# Patient Record
Sex: Female | Born: 1990 | Race: Black or African American | Hispanic: No | Marital: Single | Smoking: Never smoker
Health system: Southern US, Community
[De-identification: ages and names within clinical notes are randomized; demographics above are authoritative.]

## PROBLEM LIST (undated history)

## (undated) DIAGNOSIS — E7800 Pure hypercholesterolemia, unspecified: Secondary | ICD-10-CM

## (undated) DIAGNOSIS — R519 Headache, unspecified: Secondary | ICD-10-CM

## (undated) DIAGNOSIS — R51 Headache: Secondary | ICD-10-CM

## (undated) DIAGNOSIS — G5600 Carpal tunnel syndrome, unspecified upper limb: Secondary | ICD-10-CM

## (undated) HISTORY — DX: Carpal tunnel syndrome, unspecified upper limb: G56.00

## (undated) HISTORY — DX: Headache: R51

## (undated) HISTORY — PX: OTHER SURGICAL HISTORY: SHX169

## (undated) HISTORY — DX: Headache, unspecified: R51.9

---

## 2012-08-25 ENCOUNTER — Encounter (HOSPITAL_COMMUNITY): Payer: Self-pay

## 2012-08-25 ENCOUNTER — Emergency Department (INDEPENDENT_AMBULATORY_CARE_PROVIDER_SITE_OTHER): Payer: Self-pay

## 2012-08-25 ENCOUNTER — Emergency Department (INDEPENDENT_AMBULATORY_CARE_PROVIDER_SITE_OTHER)
Admission: EM | Admit: 2012-08-25 | Discharge: 2012-08-25 | Disposition: A | Discharge status: Home / Self Care | Payer: Self-pay | Source: Home / Self Care | Attending: Family Medicine | Admitting: Family Medicine

## 2012-08-25 DIAGNOSIS — S63642A Sprain of metacarpophalangeal joint of left thumb, initial encounter: Secondary | ICD-10-CM

## 2012-08-25 DIAGNOSIS — S63659A Sprain of metacarpophalangeal joint of unspecified finger, initial encounter: Secondary | ICD-10-CM

## 2012-08-25 HISTORY — DX: Pure hypercholesterolemia, unspecified: E78.00

## 2012-08-25 NOTE — ED Notes (Signed)
Radiology reporting system is down.  Patient, MD and RN made aware of additional delay.

## 2012-08-25 NOTE — ED Provider Notes (Signed)
History     CSN: [id number]  Arrival date & time 08/25/12  1424   First MD Initiated Contact with Patient 08/25/12 1435      Chief Complaint  Patient presents with  . Finger Injury    (Consider location/radiation/quality/duration/timing/severity/associated sxs/prior treatment) Patient is a 21 y.o. female presenting with hand injury. The history is provided by the patient.  Hand Injury  The incident occurred more than 1 week ago (hurt left thumb 2 weeks ago playing basketball, continues sore and tends to hit it daily causing pain.). The injury mechanism was a direct blow. The pain is present in the left fingers. The quality of the pain is described as aching. The pain is mild. Pertinent negatives include no fever. She reports no foreign bodies present. The symptoms are aggravated by movement, use and palpation.    Past Medical History  Diagnosis Date  . High cholesterol     History reviewed. No pertinent past surgical history.  History reviewed. No pertinent family history.  History  Substance Use Topics  . Smoking status: Never Smoker   . Smokeless tobacco: Not on file  . Alcohol Use: Yes    OB History    Grav Para Term Preterm Abortions TAB SAB Ect Mult Living                  Review of Systems  Constitutional: Negative for fever.  Musculoskeletal: Positive for joint swelling.    Allergies  Review of patient's allergies indicates no known allergies.  Home Medications  No current outpatient prescriptions on file.  BP 133/65  Pulse 62  Temp 97.3 F (36.3 C) (Oral)  Resp 20  SpO2 100%  LMP 08/23/2012  Physical Exam  Nursing note and vitals reviewed. Constitutional: She is oriented to person, place, and time. She appears well-developed and well-nourished.  Musculoskeletal: She exhibits tenderness.       Hands: Neurological: She is alert and oriented to person, place, and time.  Skin: Skin is warm and dry.    ED Course  Procedures (including critical  care time)  Labs Reviewed - No data to display Dg Finger Thumb Left  08/25/2012  *RADIOLOGY REPORT*  Clinical Data: Pain post trauma  LEFT THUMB 2+V  Comparison: None.  Findings: There is no fracture or dislocation. There is a tiny calcification in the first MCP joint which is probably of arthrographic etiology.  There is no appreciable joint space narrowing or erosive change.  IMPRESSION: No apparent fracture or dislocation.   Original Report Authenticated By: Arvin Collard. WOODRUFF III, M.D.      1. Sprain of metacarpophalangeal joint of left thumb       MDM  X-rays reviewed and report per radiologist.         Linna Hoff, MD 08/25/12 [phone]

## 2012-08-25 NOTE — ED Notes (Signed)
States a couple of days ago, she injured her thumb  that she had previously fractured . Most of pain in area of MP joint, worse w movement

## 2014-01-19 ENCOUNTER — Ambulatory Visit: Payer: BC Managed Care – PPO | Admitting: Podiatry

## 2014-01-26 ENCOUNTER — Ambulatory Visit: Payer: Self-pay | Admitting: Family Medicine

## 2014-03-09 ENCOUNTER — Other Ambulatory Visit: Payer: Self-pay | Admitting: Family

## 2014-03-09 DIAGNOSIS — N6320 Unspecified lump in the left breast, unspecified quadrant: Secondary | ICD-10-CM

## 2014-03-12 ENCOUNTER — Ambulatory Visit
Admission: RE | Admit: 2014-03-12 | Discharge: 2014-03-12 | Disposition: A | Discharge status: Home / Self Care | Payer: BC Managed Care – PPO | Source: Ambulatory Visit | Attending: Family | Admitting: Family

## 2014-03-12 ENCOUNTER — Encounter (INDEPENDENT_AMBULATORY_CARE_PROVIDER_SITE_OTHER): Payer: Self-pay

## 2014-03-12 DIAGNOSIS — N6320 Unspecified lump in the left breast, unspecified quadrant: Secondary | ICD-10-CM

## 2014-04-06 ENCOUNTER — Encounter: Payer: Self-pay | Admitting: Neurology

## 2014-04-06 ENCOUNTER — Ambulatory Visit (INDEPENDENT_AMBULATORY_CARE_PROVIDER_SITE_OTHER): Payer: BC Managed Care – PPO | Admitting: Neurology

## 2014-04-06 VITALS — BP 105/61 | HR 70 | Ht 65.0 in | Wt 161.0 lb

## 2014-04-06 DIAGNOSIS — R202 Paresthesia of skin: Secondary | ICD-10-CM | POA: Insufficient documentation

## 2014-04-06 DIAGNOSIS — R209 Unspecified disturbances of skin sensation: Secondary | ICD-10-CM

## 2014-04-06 NOTE — Progress Notes (Signed)
PATIENT: Cynthia Arroyo DOB: [birth year]  HISTORICAL  Cynthia Arroyo is a 23 years old right-handed African American female, referred to by her primary care physician nurse practitioner Andrew Auebra Smother, for evaluation of bilateral feet, and fingertips paresthesia, She is accompanied by her friend Cynthia Arroyo at today's clinical visit.  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,, packaging, she began to complains of bilateral hands paresthesia since 2014, involving all 5 fingers, right worse than left, also a radiating from her fingers towards the right arm, to her right shoulder, towards her right neck  She also complains of few months history of bilateral feet paresthesia, she has no low back pain, no significant neck pain  She denies bowel and bladder incontinence  REVIEW OF SYSTEMS: Full 14 system review of systems performed and notable only for paresthesia  ALLERGIES: No Known Allergies  HOME MEDICATIONS: No current outpatient prescriptions on file prior to visit.   No current facility-administered medications on file prior to visit.    PAST MEDICAL HISTORY: Past Medical History  Diagnosis Date  . High cholesterol   . Paresthesia     PAST SURGICAL HISTORY: Past Surgical History  Procedure Laterality Date  . None      FAMILY HISTORY: Family History  Problem Relation Age of Onset  . Diabetes Paternal Grandfather   . Diabetes Maternal Grandmother   . Asthma Brother   . High blood pressure Father     SOCIAL HISTORY:  History   Social History  . Marital Status: Single    Spouse Name: N/A    Number of Children: 0  . 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 Smoking status: Never Smoker   . Smokeless tobacco: Never Used  . Alcohol Use: 1.2 oz/week    2 Cans of beer per week     Comment: Social  . Drug Use: Yes    Special: Marijuana  . Sexual  Activity: Not on file   Other Topics Concern  . 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   Education college.   Right handed.   Caffeine none.      PHYSICAL EXAM   Filed Vitals:   04/06/14 0852  BP: 105/61  Pulse: 70  Height: 5\' 5"  (1.651 m)  Weight: 161 lb (73.029 kg)    Not recorded    Body mass index is 26.79 kg/(m^2).   Generalized: In no acute distress  Neck: Supple, no carotid bruits   Cardiac: Regular rate rhythm  Pulmonary: Clear to auscultation bilaterally  Musculoskeletal: No deformity  Neurological examination  Mentation: Alert oriented to time, place, history taking, and causual conversation  Cranial nerve II-XII: Pupils were equal round reactive to light. Extraocular movements were full.  Visual field were full on confrontational test. Bilateral fundi were sharp.  Facial sensation and strength were normal. Hearing was intact to finger rubbing bilaterally. Uvula tongue midline.  Head turning and shoulder shrug and were normal and symmetric.Tongue protrusion into cheek strength was normal.  Motor: Normal tone, bulk and strength.  Sensory: Intact to fine touch, pinprick, preserved vibratory sensation, and proprioception at toes.  Coordination: Normal finger to nose, heel-to-shin bilaterally there was no truncal ataxia  Gait: Rising up from seated position without assistance, normal stance, without trunk  ataxia, moderate stride, good arm swing, smooth turning, able to perform tiptoe, and heel walking without difficulty.   Romberg signs: Negative  Deep tendon reflexes: Brachioradialis 2/2, biceps 2/2, triceps 2/2, patellar 2/2, Achilles 2/2, plantar responses were flexor bilaterally.   DIAGNOSTIC DATA (LABS, IMAGING, TESTING) - I reviewed patient records, labs, notes, testing and imaging myself where available.  ASSESSMENT AND PLAN  Cynthia HarmsSakina Henthorn is a 23 y.o. female  complains of bilateral hands and feet paresthesia, she denies significant gait difficulty, essentially normal neurological examinations.  Differentiation diagnosis including peripheral neuropathy, bilateral carpal tunnel syndromes.  EMG nerve conduction study, Laboratory evaluations   Levert FeinsteinYijun Tarvares Lant, M.D. Ph.D.  [facility] [address], [address] [place], [place] [address] [phone]

## 2014-04-08 LAB — COMPREHENSIVE METABOLIC PANEL
A/G RATIO: 1.9 (ref 1.1–2.5)
ALT: 21 IU/L (ref 0–32)
AST: 33 IU/L (ref 0–40)
Albumin: 4.3 g/dL (ref 3.5–5.5)
Alkaline Phosphatase: 57 IU/L (ref 39–117)
BUN/Creatinine Ratio: 11 (ref 8–20)
BUN: 8 mg/dL (ref 6–20)
CALCIUM: 9.2 mg/dL (ref 8.7–10.2)
CHLORIDE: 104 mmol/L (ref 97–108)
CO2: 23 mmol/L (ref 18–29)
Creatinine, Ser: 0.71 mg/dL (ref 0.57–1.00)
GFR calc Af Amer: 138 mL/min/{1.73_m2} (ref >59)
GFR, EST NON AFRICAN AMERICAN: 120 mL/min/{1.73_m2} (ref >59)
GLUCOSE: 82 mg/dL (ref 65–99)
Globulin, Total: 2.3 g/dL (ref 1.5–4.5)
POTASSIUM: 3.9 mmol/L (ref 3.5–5.2)
Sodium: 141 mmol/L (ref 134–144)
TOTAL PROTEIN: 6.6 g/dL (ref 6.0–8.5)
Total Bilirubin: 0.2 mg/dL (ref 0.0–1.2)

## 2014-04-08 LAB — HGB A1C W/O EAG: Hgb A1c MFr Bld: 6 % — ABNORMAL HIGH (ref 4.8–5.6)

## 2014-04-08 LAB — HEPATITIS PANEL, ACUTE
Hep A IgM: NEGATIVE
Hep B C IgM: NEGATIVE
Hep C Virus Ab: <0.1 s/co ratio (ref 0.0–0.9)
Hepatitis B Surface Ag: NEGATIVE

## 2014-04-08 LAB — PROTEIN ELECTROPHORESIS
A/G RATIO SPE: 1.6 (ref 0.7–2.0)
Albumin ELP: 4.1 g/dL (ref 3.2–5.6)
Alpha 1: 0.1 g/dL (ref 0.1–0.4)
Alpha 2: 0.5 g/dL (ref 0.4–1.2)
BETA: 0.9 g/dL (ref 0.6–1.3)
GAMMA GLOBULIN: 0.9 g/dL (ref 0.5–1.6)
GLOBULIN, TOTAL: 2.5 g/dL (ref 2.0–4.5)

## 2014-04-08 LAB — CBC
HCT: 31.6 % — ABNORMAL LOW (ref 34.0–46.6)
HEMOGLOBIN: 10.2 g/dL — AB (ref 11.1–15.9)
MCH: 26.2 pg — AB (ref 26.6–33.0)
MCHC: 32.3 g/dL (ref 31.5–35.7)
MCV: 81 fL (ref 79–97)
Platelets: 350 10*3/uL (ref 150–379)
RBC: 3.89 x10E6/uL (ref 3.77–5.28)
RDW: 14.4 % (ref 12.3–15.4)
WBC: 4.7 10*3/uL (ref 3.4–10.8)

## 2014-04-08 LAB — HIV ANTIBODY (ROUTINE TESTING W REFLEX): HIV-1/HIV-2 Ab: NONREACTIVE

## 2014-04-08 LAB — RPR: SYPHILIS RPR SCR: NONREACTIVE

## 2014-04-08 LAB — THYROID PANEL WITH TSH
Free Thyroxine Index: 2.3 (ref 1.2–4.9)
T3 Uptake Ratio: 30 % (ref 24–39)
T4, Total: 7.7 ug/dL (ref 4.5–12.0)
TSH: 1.97 u[IU]/mL (ref 0.450–4.500)

## 2014-04-08 LAB — VITAMIN B12: Vitamin B-12: 513 pg/mL (ref 211–946)

## 2014-04-08 LAB — CK: Total CK: 372 U/L — ABNORMAL HIGH (ref 24–173)

## 2014-04-08 LAB — C-REACTIVE PROTEIN: CRP: 0.2 mg/L (ref 0.0–4.9)

## 2014-04-08 LAB — FOLATE: FOLATE: 17.3 ng/mL (ref >3.0)

## 2014-04-13 ENCOUNTER — Ambulatory Visit: Payer: BC Managed Care – PPO

## 2014-04-15 ENCOUNTER — Encounter: Payer: BC Managed Care – PPO | Admitting: Neurology

## 2014-04-23 ENCOUNTER — Ambulatory Visit (INDEPENDENT_AMBULATORY_CARE_PROVIDER_SITE_OTHER): Payer: BC Managed Care – PPO

## 2014-04-23 VITALS — BP 115/67 | HR 71 | Resp 16 | Ht 65.0 in | Wt 160.0 lb

## 2014-04-23 DIAGNOSIS — L600 Ingrowing nail: Secondary | ICD-10-CM

## 2014-04-23 DIAGNOSIS — M79673 Pain in unspecified foot: Secondary | ICD-10-CM

## 2014-04-23 DIAGNOSIS — M7750 Other enthesopathy of unspecified foot: Secondary | ICD-10-CM

## 2014-04-23 DIAGNOSIS — M779 Enthesopathy, unspecified: Secondary | ICD-10-CM

## 2014-04-23 DIAGNOSIS — M79609 Pain in unspecified limb: Secondary | ICD-10-CM

## 2014-04-23 DIAGNOSIS — M7751 Other enthesopathy of right foot: Secondary | ICD-10-CM

## 2014-04-23 DIAGNOSIS — Q742 Other congenital malformations of lower limb(s), including pelvic girdle: Secondary | ICD-10-CM

## 2014-04-23 DIAGNOSIS — M722 Plantar fascial fibromatosis: Secondary | ICD-10-CM

## 2014-04-23 MED ORDER — CEPHALEXIN 500 MG PO CAPS: 500.0000 mg | ORAL_CAPSULE | Freq: Three times a day (TID) | ORAL | Status: DC

## 2014-04-23 NOTE — Progress Notes (Signed)
   Subjective:    Patient ID: Cynthia Arroyo, female    DOB: Jan 20, 1991, 23 y.o.   MRN: [id number]  HPI Comments: "I have an ingrown toenail"  Patient c/o aching 1st toe right, medial border, for several weeks. She has been trimming the corner of toenail out with no relief.  Also, patient states that occasionally she has some discomfort medial feet, bilaterally. She feels knots and says she has flat feet.  Toe Pain  Associated symptoms include numbness.  Foot Pain Associated symptoms include numbness.      Review of Systems  Neurological: Positive for numbness.  All other systems reviewed and are negative.      Objective:   Physical Exam Neurovascular status is intact pedal pulses palpable DP and PT +2/4 capillary refill time 3 seconds all digits skin temperature warm turgor normal no edema rubor pallor or varicosities noted. Nail border the right great toe showing incurvation ingrowing pain to discomfort at the procedure medial border left hallux some years ago which improved that situation. 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 Visit asymptomatic with certain shoes and certain activities. Patient has significant promontory changes on weightbearing with collapse medial arch mid tarsus and promontory changes decreased calcaneal inclination angle increased talar declination angle has symptoms of capsulitis the talonavicular joint as well as plantar fascial symptomology on palpation mid band of the plantar fascial would benefit from orthoses in the future. 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 insertion site       Assessment & Plan:  We'll address the biomechanics of  the foot with orthoses at future followup visit for possible orthotic scan however this time local anesthetic block administered to the right great toe the medial border is excised in a second followed by alcohol wash Betadine ointment and dry sterile dressing applied patient is given instructions for daily soaks and Betadine or Epsom salts in warm water recommend Tylenol as needed for pain prescription for cephalexin for to pharmacy patient will do soaks twice daily as instructed reappointed for followup for nail check as well as possible orthotic scan we'll obtain authorization for orthotics in and check uncovered in the interim. Again 2 weeks followup for nail check and for capsulitis and plantar fascial symptomology patient is also advised at times navicular may be removed however should be a last resort will initially manage that symptomology with orthoses  Alvan Dame DPM

## 2014-04-23 NOTE — Patient Instructions (Signed)

## 2014-05-11 ENCOUNTER — Ambulatory Visit (INDEPENDENT_AMBULATORY_CARE_PROVIDER_SITE_OTHER): Payer: BC Managed Care – PPO

## 2014-05-11 VITALS — BP 105/58 | HR 67 | Resp 14 | Ht 66.0 in | Wt 162.0 lb

## 2014-05-11 DIAGNOSIS — L600 Ingrowing nail: Secondary | ICD-10-CM

## 2014-05-11 DIAGNOSIS — M79673 Pain in unspecified foot: Secondary | ICD-10-CM

## 2014-05-11 DIAGNOSIS — Z09 Encounter for follow-up examination after completed treatment for conditions other than malignant neoplasm: Secondary | ICD-10-CM

## 2014-05-11 DIAGNOSIS — Q742 Other congenital malformations of lower limb(s), including pelvic girdle: Secondary | ICD-10-CM

## 2014-05-11 DIAGNOSIS — M7751 Other enthesopathy of right foot: Secondary | ICD-10-CM

## 2014-05-11 DIAGNOSIS — M7750 Other enthesopathy of unspecified foot: Secondary | ICD-10-CM

## 2014-05-11 DIAGNOSIS — M79609 Pain in unspecified limb: Secondary | ICD-10-CM

## 2014-05-11 NOTE — Progress Notes (Signed)
   Subjective:    Patient ID: Cynthia Arroyo, female    DOB: [birth year], 23 y.o.   MRN: [id number]  HPI Comments: Pt still have the tenderness, but has continued to play basketball and she states that my be the reason for the continued discomfort.  Foot Pain      Review of Systems No new findings or systemic changes noted    Objective:   Physical Exam Neurovascular status is intact pedal pulses are palpable epicritic and proprioceptive sensations intact lateral border of the left hallux nail which was excised Tuesday doing well no discharge or drainage soak for about a week there is no discharge drainage no eschar present at this time no pain or discomfort patient also had problems or talonavicular insertion of the posterior tibial tendon right more so than left still wearing athletic shoes without tying laces stressed the importance of tying laces also recommended OTC orthotics such as super foot orthotic       Assessment & Plan:  Assessment resolved paronychia of the hallux no pain tenderness discomfort noted patient given advice about OTC orthotics and properly some shoes discharge to an as-needed basis for any future followup there's any read exacerbation or need for possible future custom orthotics.  Alvan Dameichard Sikora DPM

## 2014-05-11 NOTE — Patient Instructions (Signed)
Flat Feet Having flat feet is a common condition. One foot or both might be affected. People of any age can have flat feet. In fact, everyone is born with them. But most of the time, the foot gradually develops an arch. That is the curve on the bottom of the foot that creates a gap between the foot and the ground. An arch usually develops in childhood. Sometimes, though, an arch never develops and the foot stays flat on the bottom. Other times, an arch develops but later collapses (caves in). That is what gives the condition its nickname, "fallen arches." The medical term for flat feet is pes planus. Some people have flat feet their whole life and have no problems. For others, the condition causes pain and needs to be corrected.  CAUSES   A problem with the foot's soft tissue; tendons and ligaments could be loose.  This can cause what is called flexible flat feet. That means the shape of the foot changes with pressure. When standing on the toes, a curved arch can be seen. When standing on the ground, the foot is flat.  Wear and tear. Sometimes arches simply flatten over time.  Damage to the posterior tibial tendon. This is the tendon that goes from the inside of the ankle to the bones in the middle of the foot. It is the main support for the arch. If the tendon is injured, stretched or torn, the arch might flatten.  Tarsal coalition. With this condition, two or more bones in the foot are joined together (fused ) during development in the womb. This limits movement and can lead to a flat foot. SYMPTOMS   The foot is even with the ground from toe to heel. Your caregiver will look closely at the inside of the foot while you are standing.  Pain along the bottom of the foot. Some people describe the pain as tightness.  Swelling on the inside of the foot or ankle.  Changes in the way you walk (gait).  The feet lean inward, starting at the ankle (pronation). DIAGNOSIS  To decide if a child or  adult has flat feet, a healthcare provider will probably:  Do a physical examination. This might include having the person stand on his or her toes and then stand normally. The caregiver will also hold the foot and put pressure on the foot in different directions.  Check the person's shoes. The pattern of wear on the soles can offer clues.  Order images (pictures) of the foot. They can help identify the cause of any pain. They also will show injuries to bones or tendons that could be causing the condition. The images can come from:  X-rays.  Computed tomography (CT) scan. This combines X-ray and a computer.  Magnetic resonance imaging (MRI). This uses magnets, radio waves and a computer to take a picture of the foot. It is the best technique to evaluate tendons, ligaments and muscles. TREATMENT   Flexible flat feet usually are painless. Most of the time, gait is not affected. Most children grow out of the condition. Often no treatment is needed. If there is pain, treatment options include:  Orthotics. These are inserts that go in the shoes. They add support and shape to the feet. An orthotic is custom-made from a mold of the foot.  Shoes. Not all shoes are the same. People with flat feet need arch support. However, too much can be painful. It is important to find shoes that offer the right amount   of support. Athletes, especially runners, may need to try shoes made just for people with flatter feet.  Medication. For pain, only take over-the-counter medicine for pain, discomfort, as directed by your caregiver.  Rest. If the feet start to hurt, cut back on the exercise which increases the pain. Use common sense.  For damage to the posterior tibial tendon, options include:  Orthotics. Also adding a wedge on the inside edge may help. This can relieve pressure on the tendon.  Ankle brace, boot or cast. These supports can ease the load on the tendon while it heals.  Surgery. If the tendon is  torn, it might need to be repaired.  For tarsal coalition, similar options apply:  Pain medication.  Orthotics.  A cast and crutches. This keeps weight off the foot.  Physical therapy.  Surgery to remove the bone bridge joining the two bones together. PROGNOSIS  In most people, flat feet do not cause pain or problems. People can go about their normal activities. However, if flat feet are painful, they can and should be treated. Treatment usually relieves the pain. HOME CARE INSTRUCTIONS   Take any medications prescribed by the healthcare provider. Follow the directions carefully.  Wear, or make sure a child wears, orthotics or special shoes if this was suggested. Be sure to ask how often and for how long they should be worn.  Do any exercises or therapy treatments that were suggested.  Take notes on when the pain occurs. This will help healthcare providers decide how to treat the condition.  If surgery is needed, be sure to find out if there is anything that should or should not be done before the operation. SEEK MEDICAL CARE IF:   Pain worsens in the foot or lower leg.  Pain disappears after treatment, but then returns.  Walking or simple exercise becomes difficult or causes foot pain.  Orthotics or special shoes are uncomfortable or painful. Document Released: 09/02/2009 Document Revised: 01/28/2012 Document Reviewed: 09/02/2009 [facility] Patient Information 2015 [employer] [place]. This information is not intended to replace advice given to you by your health care provider. Make sure you discuss any questions you have with your health care provider.   Recommendation is to arch supports to your shoes including or athletic shoes. Super feet orthotics, a sports type insole can be obtained at [employer], or [employer]. Maintain orthotics in your athletic shoes whenever possible. Also were to keep your legs is tied snug to allow the shoe to properly support and  stabilizer foot. Leading laces loose increase his risks of injury and damage to the tendons and ligaments

## 2014-05-14 ENCOUNTER — Ambulatory Visit (INDEPENDENT_AMBULATORY_CARE_PROVIDER_SITE_OTHER): Payer: BC Managed Care – PPO | Admitting: Neurology

## 2014-05-14 ENCOUNTER — Encounter (INDEPENDENT_AMBULATORY_CARE_PROVIDER_SITE_OTHER): Payer: Self-pay

## 2014-05-14 DIAGNOSIS — Z0289 Encounter for other administrative examinations: Secondary | ICD-10-CM

## 2014-05-14 DIAGNOSIS — G5600 Carpal tunnel syndrome, unspecified upper limb: Secondary | ICD-10-CM

## 2014-05-14 DIAGNOSIS — R209 Unspecified disturbances of skin sensation: Secondary | ICD-10-CM

## 2014-05-14 DIAGNOSIS — R202 Paresthesia of skin: Secondary | ICD-10-CM

## 2014-05-14 NOTE — Procedures (Signed)
   NCS (NERVE CONDUCTION STUDY) WITH EMG (ELECTROMYOGRAPHY) REPORT   STUDY DATE: May 14 2014 PATIENT NAME: Cynthia Arroyo DOB: [birth year] MRN: [id number]    TECHNOLOGIST: Gearldine ShownLorraine Jones ELECTROMYOGRAPHER: Levert FeinsteinYan, Yijun M.D.  CLINICAL INFORMATION: 23 years old female, complaining two-month history of bilateral hands and feet paresthesia  FINDINGS: NERVE CONDUCTION STUDY: Bilateral peroneal sensory responses were normal. Bilateral peroneal, tibial motor responses were normal.  Bilateral ulnar sensory and motor responses were normal.  Bilateral median sensory stones showed moderately prolonged peak latency, left worse than right, with normal snap amplitude.  Bilateral median motor responses showed prolonged distal latency, left-sided to moderate, right side is mild, with normal C. map amplitude, absent also has mildly prolonged F-wave latency, was normal conduction velocity  NEEDLE ELECTROMYOGRAPHY: Selected needle examination was performed at right upper extremity muscles, right cervical paraspinal muscles  Needle examination of right pronator teres, extensor digital communis, triceps biceps first dorsal interossei was normal  Right abductor pollicis brevis, normally insertion activity, no spontaneous activity, normal morphology motor unit potential, with mildly decreased recruitment patterns.  There was no spontaneous activity at right cervical paraspinal muscles, right C5-6 and 7  IMPRESSION:   This is an abnormal study. There is electrodiagnostic evidence of bilateral median neuropathy across the wrist, consistent with moderate bilateral carpal tunnel syndromes, left worse than right. There was no evidence of right cervical radiculopathy.     INTERPRETING PHYSICIAN:   Levert FeinsteinYan, Yijun M.D. Ph.D. [facility] [address], [address] [place], [place] [address] [phone]

## 2014-07-19 ENCOUNTER — Emergency Department (HOSPITAL_COMMUNITY)
Admission: EM | Admit: 2014-07-19 | Discharge: 2014-07-19 | Disposition: A | Discharge status: Home / Self Care | Payer: BC Managed Care – PPO | Attending: Emergency Medicine | Admitting: Emergency Medicine

## 2014-07-19 ENCOUNTER — Encounter (HOSPITAL_COMMUNITY): Payer: Self-pay | Admitting: Emergency Medicine

## 2014-07-19 DIAGNOSIS — Z862 Personal history of diseases of the blood and blood-forming organs and certain disorders involving the immune mechanism: Secondary | ICD-10-CM | POA: Diagnosis not present

## 2014-07-19 DIAGNOSIS — R21 Rash and other nonspecific skin eruption: Secondary | ICD-10-CM | POA: Insufficient documentation

## 2014-07-19 DIAGNOSIS — Z8639 Personal history of other endocrine, nutritional and metabolic disease: Secondary | ICD-10-CM | POA: Insufficient documentation

## 2014-07-19 MED ORDER — HYDROCORTISONE 1 % EX CREA: TOPICAL_CREAM | CUTANEOUS | Status: DC

## 2014-07-19 MED ORDER — HYDROXYZINE HCL 25 MG PO TABS: 25.0000 mg | ORAL_TABLET | Freq: Four times a day (QID) | ORAL | Status: DC | PRN

## 2014-07-19 MED ORDER — DIPHENHYDRAMINE HCL 25 MG PO CAPS
25.0000 mg | ORAL_CAPSULE | Freq: Once | ORAL | Status: AC
  Administered 2014-07-19: 25 mg via ORAL
  Filled 2014-07-19: qty 1

## 2014-07-19 NOTE — ED Provider Notes (Signed)
CSN: [id number]     Arrival date & time 07/19/14  0014 History   First MD Initiated Contact with Patient 07/19/14 0020     Chief Complaint  Patient presents with  . Rash     (Consider location/radiation/quality/duration/timing/severity/associated sxs/prior Treatment) HPI  This is a 84 are old female who presents with rash. Patient reports onset of rash last Saturday. She is noted multiple areas of skin redness with associated itching. She thinks she may have been bitten. She has several sites on her left arm, one on her left leg, and the left side of her face. She states that she has a new bed and would have low suspicion for bed bugs. No when she was with has similar symptoms. Patient has not taken anything for her itching. She denies any fevers or systemic symptoms. She denies any new soaps, lotions, medications, foods or any new exposures.  Past Medical History  Diagnosis Date  . High cholesterol   . Paresthesia    Past Surgical History  Procedure Laterality Date  . None     Family History  Problem Relation Age of Onset  . Diabetes Paternal Grandfather   . Diabetes Maternal Grandmother   . Asthma Brother   . High blood pressure Father    History  Substance Use Topics  . Smoking status: Never Smoker   . Smokeless tobacco: Never Used  . Alcohol Use: 1.2 oz/week    2 Cans of beer per week     Comment: Social   OB History   Grav Para Term Preterm Abortions TAB SAB Ect Mult Living                 Review of Systems  Constitutional: Negative for fever.  Skin: Positive for rash.  All other systems reviewed and are negative.     Allergies  Review of patient's allergies indicates no known allergies.  Home Medications   Prior to Admission medications   Medication Sig Start Date End Date Taking? Authorizing Provider  hydrocortisone cream 1 % Apply to affected area 2 times daily 07/19/14   Shon Baton, MD  hydrOXYzine (ATARAX/VISTARIL) 25 MG tablet Take 1 tablet  (25 mg total) by mouth every 6 (six) hours as needed for itching. 07/19/14   Shon Baton, MD   BP 121/50  Pulse 64  Temp(Src) 98.8 F (37.1 C) (Oral)  Resp 20  Ht  (1.676 m)  Wt 160 lb (72.576 kg)  BMI 25.84 kg/m2  SpO2 99%  LMP 06/26/2014 Physical Exam  Nursing note and vitals reviewed. Constitutional: She is oriented to person, place, and time. She appears well-developed and well-nourished.  HENT:  Head: Normocephalic and atraumatic.  Cardiovascular: Normal rate and regular rhythm.   Pulmonary/Chest: Effort normal. No respiratory distress.  Musculoskeletal: She exhibits no edema.  Neurological: She is alert and oriented to person, place, and time.  Skin: Skin is warm and dry.  Multiple <dime sized areas were raised patches over the left arm the largest of which is just distal to the left elbow, and no associated fluctuance or induration, 2 similar lesions noted on the face and one on the left leg, no obvious bite marks or tracks, nonpurulent,mild overlying excoriation  Psychiatric: She has a normal mood and affect.    ED Course  Procedures (including critical care time) Labs Review Labs Reviewed - No data to display  Imaging Review No results found.   EKG Interpretation None      MDM  Final diagnoses:  Rash and nonspecific skin eruption    Patient presents with itchy patches over her left arm, left leg, left face. Denies systemic symptoms. Rash is most consistent with small whelps or urticaria. Given that it is itchy, would suspect allergic component. The suspicion at this time for bite or infection. Patient was given Benadryl. Discussed with patient conservative management including Atarax and topical hydrocortisone. There is no evidence of overlying infection at this time but the patient was given precautions.  After history, exam, and medical workup I feel the patient has been appropriately medically screened and is safe for discharge home. Pertinent  diagnoses were discussed with the patient. Patient was given return precautions.     Shon Baton, MD 07/19/14 0100

## 2014-07-19 NOTE — ED Notes (Signed)
Patient here with complaint of itchy bumps on left arm, left ear, and left leg. States that the bumps appeared on Saturday and haven't improved. Bumps are raised, warm, and reddened. 1 particular spot distal to left elbow appears swollen with significant induration. Denies seeing any insects or experiencing any bites. Explains that they first appeared after waking, rash was not present Friday when going to bed.

## 2014-07-19 NOTE — Discharge Instructions (Signed)
Rash A rash is a change in the color or texture of your skin. There are many different types of rashes. You may have other problems that accompany your rash.  Your rash appears to have an allergic component given the itching and persistence.  Low suspicion for infection.  CAUSES   Infections.  Allergic reactions. This can include allergies to pets or foods.  Certain medicines.  Exposure to certain chemicals, soaps, or cosmetics.  Heat.  Exposure to poisonous plants.  Tumors, both cancerous and noncancerous. SYMPTOMS   Redness.  Scaly skin.  Itchy skin.  Dry or cracked skin.  Bumps.  Blisters.  Pain. DIAGNOSIS  Your caregiver may do a physical exam to determine what type of rash you have. A skin sample (biopsy) may be taken and examined under a microscope. TREATMENT  Treatment depends on the type of rash you have. Your caregiver may prescribe certain medicines. For serious conditions, you may need to see a skin doctor (dermatologist). HOME CARE INSTRUCTIONS   Avoid the substance that caused your rash.  Do not scratch your rash. This can cause infection.  You may take cool baths to help stop itching.  Only take over-the-counter or prescription medicines as directed by your caregiver.  Keep all follow-up appointments as directed by your caregiver. SEEK IMMEDIATE MEDICAL CARE IF:  You have increasing pain, swelling, or redness.  You have a fever.  You have new or severe symptoms.  You have body aches, diarrhea, or vomiting.  Your rash is not better after 3 days. MAKE SURE YOU:  Understand these instructions.  Will watch your condition.  Will get help right away if you are not doing well or get worse. Document Released: 10/26/2002 Document Revised: 01/28/2012 Document Reviewed: 08/20/2011 [facility] Patient Information 2015 [place], [place]. This information is not intended to replace advice given to you by your health care provider. Make sure you discuss  any questions you have with your health care provider.

## 2016-01-11 ENCOUNTER — Encounter (HOSPITAL_COMMUNITY): Payer: Self-pay | Admitting: Emergency Medicine

## 2016-01-11 ENCOUNTER — Emergency Department (HOSPITAL_COMMUNITY)
Admission: EM | Admit: 2016-01-11 | Discharge: 2016-01-12 | Disposition: A | Discharge status: Home / Self Care | Payer: Self-pay | Attending: Emergency Medicine | Admitting: Emergency Medicine

## 2016-01-11 DIAGNOSIS — R519 Headache, unspecified: Secondary | ICD-10-CM

## 2016-01-11 DIAGNOSIS — Z3202 Encounter for pregnancy test, result negative: Secondary | ICD-10-CM | POA: Insufficient documentation

## 2016-01-11 DIAGNOSIS — R51 Headache: Secondary | ICD-10-CM | POA: Insufficient documentation

## 2016-01-11 DIAGNOSIS — Z8639 Personal history of other endocrine, nutritional and metabolic disease: Secondary | ICD-10-CM | POA: Insufficient documentation

## 2016-01-11 DIAGNOSIS — Z7952 Long term (current) use of systemic steroids: Secondary | ICD-10-CM | POA: Insufficient documentation

## 2016-01-11 LAB — CBC WITH DIFFERENTIAL/PLATELET
Basophils Absolute: 0 10*3/uL (ref 0.0–0.1)
Basophils Relative: 1 %
EOS ABS: 0 10*3/uL (ref 0.0–0.7)
EOS PCT: 0 %
HCT: 33.8 % — ABNORMAL LOW (ref 36.0–46.0)
Hemoglobin: 11 g/dL — ABNORMAL LOW (ref 12.0–15.0)
LYMPHS ABS: 1.8 10*3/uL (ref 0.7–4.0)
Lymphocytes Relative: 34 %
MCH: 25.2 pg — AB (ref 26.0–34.0)
MCHC: 32.5 g/dL (ref 30.0–36.0)
MCV: 77.3 fL — ABNORMAL LOW (ref 78.0–100.0)
Monocytes Absolute: 0.4 10*3/uL (ref 0.1–1.0)
Monocytes Relative: 8 %
Neutro Abs: 3 10*3/uL (ref 1.7–7.7)
Neutrophils Relative %: 57 %
PLATELETS: 238 10*3/uL (ref 150–400)
RBC: 4.37 MIL/uL (ref 3.87–5.11)
RDW: 14.5 % (ref 11.5–15.5)
WBC: 5.2 10*3/uL (ref 4.0–10.5)

## 2016-01-11 LAB — URINE MICROSCOPIC-ADD ON

## 2016-01-11 LAB — URINALYSIS, ROUTINE W REFLEX MICROSCOPIC
BILIRUBIN URINE: NEGATIVE
Glucose, UA: NEGATIVE mg/dL
HGB URINE DIPSTICK: NEGATIVE
KETONES UR: 15 mg/dL — AB
Leukocytes, UA: NEGATIVE
Nitrite: NEGATIVE
PROTEIN: 30 mg/dL — AB
Specific Gravity, Urine: 1.024 (ref 1.005–1.030)
pH: 7 (ref 5.0–8.0)

## 2016-01-11 LAB — BASIC METABOLIC PANEL
Anion gap: 9 (ref 5–15)
BUN: 5 mg/dL — AB (ref 6–20)
CO2: 26 mmol/L (ref 22–32)
CREATININE: 0.6 mg/dL (ref 0.44–1.00)
Calcium: 9.2 mg/dL (ref 8.9–10.3)
Chloride: 104 mmol/L (ref 101–111)
GFR calc Af Amer: >60 mL/min (ref >60)
Glucose, Bld: 127 mg/dL — ABNORMAL HIGH (ref 65–99)
POTASSIUM: 3.4 mmol/L — AB (ref 3.5–5.1)
SODIUM: 139 mmol/L (ref 135–145)

## 2016-01-11 LAB — POC URINE PREG, ED: PREG TEST UR: NEGATIVE

## 2016-01-11 MED ORDER — SODIUM CHLORIDE 0.9 % IV BOLUS (SEPSIS)
1000.0000 mL | Freq: Once | INTRAVENOUS | Status: AC
  Administered 2016-01-11: 1000 mL via INTRAVENOUS

## 2016-01-11 MED ORDER — METOCLOPRAMIDE HCL 5 MG/ML IJ SOLN
10.0000 mg | Freq: Once | INTRAMUSCULAR | Status: AC
  Administered 2016-01-11: 10 mg via INTRAVENOUS
  Filled 2016-01-11: qty 2

## 2016-01-11 MED ORDER — OXYCODONE-ACETAMINOPHEN 5-325 MG PO TABS
ORAL_TABLET | ORAL | Status: AC
  Filled 2016-01-11: qty 1

## 2016-01-11 MED ORDER — KETOROLAC TROMETHAMINE 30 MG/ML IJ SOLN
30.0000 mg | Freq: Once | INTRAMUSCULAR | Status: AC
  Administered 2016-01-11: 30 mg via INTRAVENOUS
  Filled 2016-01-11: qty 1

## 2016-01-11 MED ORDER — DIPHENHYDRAMINE HCL 50 MG/ML IJ SOLN
25.0000 mg | Freq: Once | INTRAMUSCULAR | Status: AC
  Administered 2016-01-11: 25 mg via INTRAVENOUS
  Filled 2016-01-11: qty 1

## 2016-01-11 MED ORDER — OXYCODONE-ACETAMINOPHEN 5-325 MG PO TABS
1.0000 | ORAL_TABLET | Freq: Once | ORAL | Status: AC
  Administered 2016-01-11: 1 via ORAL

## 2016-01-11 NOTE — ED Notes (Signed)
Pt. reports headache with nausea/emesis and photophobia onset last week , denies head injury/alert and oriented.

## 2016-01-11 NOTE — ED Notes (Signed)
PA to see and assess patient before RN assessment. See PA-C note. 

## 2016-01-11 NOTE — ED Provider Notes (Signed)
CSN: [id number]     Arrival date & time 01/11/16  2210 History   First MD Initiated Contact with Patient 01/11/16 2328     Chief Complaint  Patient presents with  . Headache    HPI   Cynthia Arroyo is an 25 y.o. female who presents for evaluation of headache. She states the headache began about one week ago. She reports an intermittent, throbbing headache of gradual onset. Denies thunderclap onset. States that it is always frontal and temporal but will at times radiate through her parietal regions to her occiput. She reports associated photophobia, N/V, and lightheadedness. Denies LOC. States she has been unable to eat at times due to nausea. She reports she has had "sinus headaches" before but not a headache like this. She reports it feels like throbbing pressure. She has tried OTC meds with no relief. Denies fever, chills. Denies dizziness, weakness, numbness, gait abnormalities. Denies head injury or LOC.   Past Medical History  Diagnosis Date  . High cholesterol   . Paresthesia    Past Surgical History  Procedure Laterality Date  . None     Family History  Problem Relation Age of Onset  . Diabetes Paternal Grandfather   . Diabetes Maternal Grandmother   . Asthma Brother   . High blood pressure Father    Social History  Substance Use Topics  . Smoking status: Never Smoker   . Smokeless tobacco: Never Used  . Alcohol Use: 1.2 oz/week    2 Cans of beer per week     Comment: Social   OB History    No data available     Review of Systems  All other systems reviewed and are negative.     Allergies  Review of patient's allergies indicates no known allergies.  Home Medications   Prior to Admission medications   Medication Sig Start Date End Date Taking? Authorizing Provider  hydrocortisone cream 1 % Apply to affected area 2 times daily 07/19/14   Shon Baton, MD  hydrOXYzine (ATARAX/VISTARIL) 25 MG tablet Take 1 tablet (25 mg total) by mouth every 6 (six) hours as  needed for itching. 07/19/14   Shon Baton, MD   BP 112/65 mmHg  Pulse 73  Temp(Src) 99 F (37.2 C) (Oral)  Resp 17  Ht  (1.651 m)  Wt 68.947 kg  BMI 25.29 kg/m2  SpO2 100%  LMP 01/01/2016 (Approximate) Physical Exam  Constitutional: She is oriented to person, place, and time. No distress.  HENT:  Head: Atraumatic.  Right Ear: External ear normal.  Left Ear: External ear normal.  Nose: Nose normal.  Mouth/Throat: No oropharyngeal exudate.  Eyes: Conjunctivae and EOM are normal. Pupils are equal, round, and reactive to light. No scleral icterus.  Neck: Normal range of motion. Neck supple. No rigidity. No Brudzinski's sign and no Kernig's sign noted.  Cardiovascular: Normal rate, regular rhythm and normal heart sounds.   Pulmonary/Chest: Effort normal and breath sounds normal. No respiratory distress. She exhibits no tenderness.  Abdominal: Soft. She exhibits no distension. There is no tenderness.  Lymphadenopathy:    She has no cervical adenopathy.  Neurological: She is alert and oriented to person, place, and time. She has normal strength and normal reflexes. No cranial nerve deficit or sensory deficit. Coordination and gait normal.  Skin: Skin is warm and dry. She is not diaphoretic.  Psychiatric: She has a normal mood and affect. Her behavior is normal.  Nursing note and vitals reviewed.  ED Course  Procedures (including critical care time) Labs Review Labs Reviewed  CBC WITH DIFFERENTIAL/PLATELET - Abnormal; Notable for the following:    Hemoglobin 11.0 (*)    HCT 33.8 (*)    MCV 77.3 (*)    MCH 25.2 (*)    All other components within normal limits  BASIC METABOLIC PANEL - Abnormal; Notable for the following:    Potassium 3.4 (*)    Glucose, Bld 127 (*)    BUN 5 (*)    All other components within normal limits  URINALYSIS, ROUTINE W REFLEX MICROSCOPIC (NOT AT [facility]) - Abnormal; Notable for the following:    Color, Urine AMBER (*)    APPearance CLOUDY (*)     Ketones, ur 15 (*)    Protein, ur 30 (*)    All other components within normal limits  URINE MICROSCOPIC-ADD ON - Abnormal; Notable for the following:    Squamous Epithelial / LPF 6-30 (*)    Bacteria, UA MANY (*)    All other components within normal limits  POC URINE PREG, ED    Imaging Review No results found. I have personally reviewed and evaluated these images and lab results as part of my medical decision-making.   EKG Interpretation None      MDM   Final diagnoses:  Acute nonintractable headache, unspecified headache type    Labs drawn in triage unremarkable. Pt was given percocet in triage with minimal relief of symptoms.  UA with many bacteria but I suspect contamination. Will send for culture. Pt denies any urinary complaints at this time. I suspect pt has migraine. She does not have a history of migraines but otherwise has a nonfocal exam with no neuro deficits, no meningismus. She is afebrile and nontoxic appearing. Will give 1L NS bolus and migraine cocktail. I offered CT head though I have low suspicion for acute intra cranial abnormality. Pt declines at this time. Anticipate d/c home with PCP f/u with neuro referral prn.   Pt reports headache completely resolved with meds and fluids. Will d/c home with strict ER return precautions. Resource guide given to establish PCP for f/u. Referral to neuro also given. Pt verbalized agreement and understanding.   Carlene Coria, PA-C 01/12/16 2956  Loren Racer, MD 01/12/16 [phone]

## 2016-01-12 NOTE — ED Notes (Signed)
Patient verbalized understanding of discharge instructions and denies any further needs or questions at this time. VS stable. Patient ambulatory with steady gait.  

## 2016-01-12 NOTE — Discharge Instructions (Signed)
You were seen in the ER today for evaluation of headache. Your bloodwork was normal. Your headache resolved with medication and IV fluids. Please follow up with your primary care provider. If you do not have one, you may contact one from the list below. I also gave you the contact information for Neurology in case the headache returns. Return to the ER for new or worsening symptoms.  Take medications as prescribed. Return to the emergency room for worsening condition or new concerning symptoms. Follow up with your regular doctor. If you don't have a regular doctor use one of the numbers below to establish a primary care doctor.   Emergency Department Resource Guide 1) Find a Doctor and Pay Out of Pocket Although you won't have to find out who is covered by your insurance plan, it is a good idea to ask around and get recommendations. You will then need to call the office and see if the doctor you have chosen will accept you as a new patient and what types of options they offer for patients who are self-pay. Some doctors offer discounts or will set up payment plans for their patients who do not have insurance, but you will need to ask so you aren't surprised when you get to your appointment.  2) Contact Your Local Health Department Not all health departments have doctors that can see patients for sick visits, but many do, so it is worth a call to see if yours does. If you don't know where your local health department is, you can check in your phone book. The CDC also has a tool to help you locate your state's health department, and many state websites also have listings of all of their local health departments.  3) Find a Walk-in Clinic If your illness is not likely to be very severe or complicated, you may want to try a walk in clinic. These are popping up all over the country in pharmacies, drugstores, and shopping centers. They're usually staffed by nurse practitioners or physician assistants that have  been trained to treat common illnesses and complaints. They're usually fairly quick and inexpensive. However, if you have serious medical issues or chronic medical problems, these are probably not your best option.  No Primary Care Doctor: - Call Health Connect at  [phone] - they can help you locate a primary care doctor that  accepts your insurance, provides certain services, etc. - Physician Referral [phone]  Emergency Department Resource Guide 1) Find a Doctor and Pay Out of Pocket Although you won't have to find out who is covered by your insurance plan, it is a good idea to ask around and get recommendations. You will then need to call the office and see if the doctor you have chosen will accept you as a new patient and what types of options they offer for patients who are self-pay. Some doctors offer discounts or will set up payment plans for their patients who do not have insurance, but you will need to ask so you aren't surprised when you get to your appointment.  2) Contact Your Local Health Department Not all health departments have doctors that can see patients for sick visits, but many do, so it is worth a call to see if yours does. If you don't know where your local health department is, you can check in your phone book. The CDC also has a tool to help you locate your state's health department, and many state websites also have listings of  all of their local health departments.  3) Find a Walk-in Clinic If your illness is not likely to be very severe or complicated, you may want to try a walk in clinic. These are popping up all over the country in pharmacies, drugstores, and shopping centers. They're usually staffed by nurse practitioners or physician assistants that have been trained to treat common illnesses and complaints. They're usually fairly quick and inexpensive. However, if you have serious medical issues or chronic medical problems, these are probably not your  best option.  No Primary Care Doctor: - Call Health Connect at  [phone] - they can help you locate a primary care doctor that  accepts your insurance, provides certain services, etc. - Physician Referral Service- [phone]  Chronic Pain Problems: Organization         Address  Phone   Notes  [facility]  [phone] Patients need to be referred by their primary care doctor.   Medication Assistance: Organization         Address  Phone   Notes  [facility] Medication [facility] [address] [place], [place] [address] [phone] --Must be a resident of [facility] -- Must have NO insurance coverage whatsoever (no Medicaid/ Medicare, etc.) -- The pt. MUST have a primary care doctor that directs their care regularly and follows them in the community   [facility]  [phone]   [employer]  [phone]    Agencies that provide inexpensive medical care: Organization         Address  Phone   Notes  [facility]  [phone]   [facility]    [phone]   [facility] [address] [place], [place] [address] [phone]   [facility] [address] [place] [phone]   [facility]    [phone]   [facility]    [phone]   [facility]  [address], [place] Phone:  [phone], Fax:  [phone] Hours of Operation:  9 am - 6 pm, M-F.  Also accepts Medicaid/Medicare and self-pay.  [facility]  [address], [place] Phone: [phone], Fax: [phone]. Hours of Operation:  8:30 am - 5:30 pm, M-F.  Also accepts Medicaid and self-pay.  [facility] [place] [address], [place] Phone: [phone]   [facility] [address] Natasha Bence [place], [place] [phone], Ext. [phone] Mondays & Thursdays: 7-9 AM.  First 15  patients are seen on a first come, first serve basis.    Medicaid-accepting [facility] Providers:  Organization         Address  Phone   Notes  [facility] [address], [place] [phone] Also accepts self-pay patients.  [facility] [address] Laurell Josephs [place], [place]  [phone]   [facility] [address], [place] [phone]   [facility] [address], [place] [phone]   Renaye Rakers [address], [place]   [phone] Only accepts [place] patients after they have their name applied to their card.   Self-Pay (no insurance) in [facility]:  Retail buyer   Notes  Sickle Cell Patients, [facility] [address] [place], [place] [phone]   [facility] [address] [place], [place] [phone]   [facility] [place]  [address], [place] [phone]   [facility]/Dr. Osei-Bonsu  [address] [place] or [address], [place] [phone] Phone number for both [place] and [place] locations is the same.  [facility] [address], [place] [phone]   [facility] [address], [place] or [address] [phone]   [facility] [address] [place] [phone], phone; [phone], fax Sees patients 1st and 3rd Saturday of every month.  Must not qualify for public or private insurance (i.e. Medicaid, Medicare, [place] Health Choice, Veterans' Benefits)  Household income should be no more than 200% of the poverty level The clinic cannot treat you if you are pregnant or think you are pregnant  Sexually transmitted diseases are not treated at the clinic.     Migraine  Headache A migraine headache is an intense, throbbing pain on one or both sides of your head. A migraine can last for 30 minutes to several hours. CAUSES  The exact cause of a migraine headache is not always known. However, a migraine may be caused when nerves in the brain become irritated and release chemicals that cause inflammation. This causes pain. Certain things may also trigger migraines, such as:  Alcohol.  Smoking.  Stress.  Menstruation.  Aged cheeses.  Foods or drinks that contain nitrates, glutamate, aspartame, or tyramine.  Lack of sleep.  Chocolate.  Caffeine.  Hunger.  Physical exertion.  Fatigue.  Medicines used to treat chest pain (nitroglycerine), birth control pills, estrogen, and some blood pressure medicines. SIGNS AND SYMPTOMS  Pain on one or both sides of your head.  Pulsating or throbbing pain.  Severe pain that prevents daily activities.  Pain that is aggravated by any physical activity.  Nausea, vomiting, or both.  Dizziness.  Pain with exposure to bright lights, loud noises, or activity.  General sensitivity to bright lights, loud noises, or smells. Before you get a migraine, you may get warning signs that a migraine is coming (aura). An aura may include:  Seeing flashing lights.  Seeing bright spots, halos, or zigzag lines.  Having tunnel vision or blurred vision.  Having feelings of numbness or tingling.  Having trouble talking.  Having muscle weakness. DIAGNOSIS  A migraine headache is often diagnosed based on:  Symptoms.  Physical exam.  A CT scan or MRI of your head. These imaging tests cannot diagnose migraines, but they can help rule out other causes of headaches. TREATMENT Medicines may be given for pain and nausea. Medicines can also be given to help prevent recurrent migraines.  HOME CARE INSTRUCTIONS  Only take over-the-counter or prescription medicines for pain or discomfort as directed by your health care  provider. The use of long-term narcotics is not recommended.  Lie down in a dark, quiet room when you have a migraine.  Keep a journal to find out what may trigger your migraine headaches. For example, write down:  What you eat and drink.  How much sleep you get.  Any change to your diet or medicines.  Limit alcohol consumption.  Quit smoking if you smoke.  Get 7-9 hours of sleep, or as recommended by your health care provider.  Limit stress.  Keep lights dim if bright lights bother you and make your migraines worse. SEEK IMMEDIATE MEDICAL CARE IF:   Your migraine becomes severe.  You have a fever.  You have a stiff neck.  You have vision loss.  You have muscular weakness or loss of muscle control.  You start losing your balance or have trouble walking.  You feel faint or pass out.  You have severe symptoms that are different from your first symptoms. MAKE SURE YOU:   Understand these instructions.  Will watch your condition.  Will get help right away if you are not doing well or get worse.   This information is not intended to replace advice given to you by your health care provider. Make sure you discuss any questions you have with your health care provider.   Document Released: 11/05/2005 Document Revised: 11/26/2014 Document Reviewed: 07/13/2013 [employer] Interactive Patient Education [employer]

## 2016-01-13 LAB — URINE CULTURE

## 2016-01-16 ENCOUNTER — Ambulatory Visit: Payer: Self-pay | Admitting: Diagnostic Neuroimaging

## 2016-01-19 ENCOUNTER — Encounter: Payer: Self-pay | Admitting: Neurology

## 2016-01-19 ENCOUNTER — Ambulatory Visit (INDEPENDENT_AMBULATORY_CARE_PROVIDER_SITE_OTHER): Payer: Self-pay | Admitting: Neurology

## 2016-01-19 VITALS — BP 110/60 | HR 70 | Ht 65.0 in | Wt 156.0 lb

## 2016-01-19 DIAGNOSIS — G43009 Migraine without aura, not intractable, without status migrainosus: Secondary | ICD-10-CM

## 2016-01-19 DIAGNOSIS — G43909 Migraine, unspecified, not intractable, without status migrainosus: Secondary | ICD-10-CM | POA: Insufficient documentation

## 2016-01-19 MED ORDER — RIZATRIPTAN BENZOATE 5 MG PO TBDP: 5.0000 mg | ORAL_TABLET | ORAL | Status: AC | PRN

## 2016-01-19 NOTE — Progress Notes (Signed)
Chief Complaint  Patient presents with  . Headache    Says she started having daily headaches two weeks ago.  She tried to treat pain with OTC NSAIDS with very little relief.  Reports nausea and light sensitivity.  She went to ED for pain treatment on 01/11/16.      PATIENT: Cynthia Arroyo DOB: 08/18/1991  HISTORICAL  Cynthia Arroyo is a 25 years old right-handed African American female, referred to by her primary care physician nurse practitioner Andrew Au, for evaluation of bilateral feet, and fingertips paresthesia, She is accompanied by her friend Cynthia Arroyo at today's clinical visit.  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,, packaging, she began to complains of bilateral hands paresthesia since 2014, involving all 5 fingers, right worse than left, also a radiating from her fingers towards the right arm, to her right shoulder, towards her right neck  She also complains of few months history of bilateral feet paresthesia, she has no low back pain, no significant neck pain  She denies bowel and bladder incontinence  UPDATE March 2nd 2017: I saw her previously for paresthesia which has improved, last visit was in 2015, Today she came in with new onset headache since Feb 2017, severe holocranial pounding headaches, with associated light noise sensitivity, nauseous, lasting for few hours, with light noise sensitivity, she tried over-the-counter Excedrin Migraine, which only helped her temporarily. She denies visual loss, she denies family history of migraine, she has practiced basketball regularly, no lateralized motor or sensory deficit.  REVIEW OF SYSTEMS: Full 14 system review of systems performed and notable only for paresthesia  ALLERGIES: No Known Allergies  HOME MEDICATIONS: No current outpatient prescriptions on file prior to visit.   No current facility-administered medications on file prior to visit.    PAST MEDICAL  HISTORY: Past Medical History  Diagnosis Date  . High cholesterol   . Carpal tunnel syndrome     Left  . Headache     PAST SURGICAL HISTORY: Past Surgical History  Procedure Laterality Date  . None      FAMILY HISTORY: Family History  Problem Relation Age of Onset  . Diabetes Paternal Grandfather   . Diabetes Maternal Grandmother   . Asthma Brother   . High blood pressure Father   . Healthy Mother     SOCIAL HISTORY:  History   Social History  . Marital Status: Single    Spouse Name: N/A    Number of Children: 0  . 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 Smoking status: Never Smoker   . Smokeless tobacco: Never Used  . Alcohol Use: 1.2 oz/week    2 Cans of beer per week     Comment: Social  . Drug Use: Yes    Special: Marijuana  . Sexual Activity: Not on file   Other Topics Concern  . 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   Education college.   Right handed.   Caffeine none.      PHYSICAL EXAM   Filed Vitals:   01/19/16 1117  BP: 110/60  Pulse: 70  Height:  (1.651 m)  Weight: 156 lb (70.761 kg)    Not recorded      Body mass index is 25.96 kg/(m^2).   Generalized:  In no acute distress  Neck: Supple, no carotid bruits   Cardiac: Regular rate rhythm  Pulmonary: Clear to auscultation bilaterally  Musculoskeletal: No deformity  Neurological examination  Mentation: Alert oriented to time, place, history taking, and causual conversation  Cranial nerve II-XII: Pupils were equal round reactive to light. Extraocular movements were full.  Visual field were full on confrontational test. Bilateral fundi were sharp.  Facial sensation and strength were normal. Hearing was intact to finger rubbing bilaterally. Uvula tongue midline.  Head turning and shoulder shrug and were normal and  symmetric.Tongue protrusion into cheek strength was normal.  Motor: Normal tone, bulk and strength.  Sensory: Intact to fine touch, pinprick, preserved vibratory sensation, and proprioception at toes.  Coordination: Normal finger to nose, heel-to-shin bilaterally there was no truncal ataxia  Gait: Rising up from seated position without assistance, normal stance, without trunk ataxia, moderate stride, good arm swing, smooth turning, able to perform tiptoe, and heel walking without difficulty.   Romberg signs: Negative  Deep tendon reflexes: Brachioradialis 2/2, biceps 2/2, triceps 2/2, patellar 2/2, Achilles 2/2, plantar responses were flexor bilaterally.   DIAGNOSTIC DATA (LABS, IMAGING, TESTING) - I reviewed patient records, labs, notes, testing and imaging myself where available.  ASSESSMENT AND PLAN  Cynthia Arroyo is a 25 y.o. female   New-onset migraine headaches  Maxalt as needed  I also educated patient avoid trigger, treat her migraine headache early on, keep a headache diary  Return to clinic in 2-3 months  Levert Feinstein, M.D. Ph.D.  [facility] [address] [place], [place] [address] [phone]

## 2016-01-28 ENCOUNTER — Emergency Department (HOSPITAL_COMMUNITY)
Admission: EM | Admit: 2016-01-28 | Discharge: 2016-01-28 | Disposition: A | Discharge status: Home / Self Care | Payer: Self-pay | Attending: Emergency Medicine | Admitting: Emergency Medicine

## 2016-01-28 ENCOUNTER — Encounter (HOSPITAL_COMMUNITY): Payer: Self-pay | Admitting: *Deleted

## 2016-01-28 DIAGNOSIS — Z8669 Personal history of other diseases of the nervous system and sense organs: Secondary | ICD-10-CM | POA: Insufficient documentation

## 2016-01-28 DIAGNOSIS — Z8639 Personal history of other endocrine, nutritional and metabolic disease: Secondary | ICD-10-CM | POA: Insufficient documentation

## 2016-01-28 DIAGNOSIS — R51 Headache: Secondary | ICD-10-CM | POA: Insufficient documentation

## 2016-01-28 DIAGNOSIS — R519 Headache, unspecified: Secondary | ICD-10-CM

## 2016-01-28 LAB — I-STAT CHEM 8, ED
BUN: 4 mg/dL — ABNORMAL LOW (ref 6–20)
CALCIUM ION: 1.13 mmol/L (ref 1.12–1.23)
CHLORIDE: 103 mmol/L (ref 101–111)
Creatinine, Ser: 0.6 mg/dL (ref 0.44–1.00)
GLUCOSE: 95 mg/dL (ref 65–99)
HEMATOCRIT: 34 % — AB (ref 36.0–46.0)
Hemoglobin: 11.6 g/dL — ABNORMAL LOW (ref 12.0–15.0)
Potassium: 3 mmol/L — ABNORMAL LOW (ref 3.5–5.1)
Sodium: 141 mmol/L (ref 135–145)
TCO2: 24 mmol/L (ref 0–100)

## 2016-01-28 MED ORDER — METOCLOPRAMIDE HCL 5 MG/ML IJ SOLN
10.0000 mg | Freq: Once | INTRAMUSCULAR | Status: AC
  Administered 2016-01-28: 10 mg via INTRAMUSCULAR
  Filled 2016-01-28: qty 2

## 2016-01-28 MED ORDER — OXYCODONE-ACETAMINOPHEN 5-325 MG PO TABS
1.0000 | ORAL_TABLET | Freq: Once | ORAL | Status: AC
  Administered 2016-01-28: 1 via ORAL
  Filled 2016-01-28: qty 1

## 2016-01-28 MED ORDER — KETOROLAC TROMETHAMINE 60 MG/2ML IM SOLN
60.0000 mg | Freq: Once | INTRAMUSCULAR | Status: AC
  Administered 2016-01-28: 60 mg via INTRAMUSCULAR
  Filled 2016-01-28: qty 2

## 2016-01-28 NOTE — ED Provider Notes (Signed)
CSN: [id number]     Arrival date & time 01/28/16  0139 History   First MD Initiated Contact with Patient 01/28/16 0500     Chief Complaint  Patient presents with  . Headache     HPI Patient presents to the emergency department with complaints of headache over the past 3-4 weeks.  She has seen Dr. Terrace ArabiaYan and of neurology but states medication she prescribed has not been helping.  She has not contacted her for follow-up.  She denies nausea and vomiting.  She reports not try any over-the-counter medication.  She reports photophobia.  No recent injury or trauma.  Pain is moderate in severity.  No fever or chills.  No Neck pain or stiffness.   Past Medical History  Diagnosis Date  . High cholesterol   . Carpal tunnel syndrome     Left  . Headache    Past Surgical History  Procedure Laterality Date  . None     Family History  Problem Relation Age of Onset  . Diabetes Paternal Grandfather   . Diabetes Maternal Grandmother   . Asthma Brother   . High blood pressure Father   . Healthy Mother    Social History  Substance Use Topics  . Smoking status: Never Smoker   . Smokeless tobacco: Never Used  . Alcohol Use: 1.2 oz/week    2 Cans of beer per week     Comment: Social   OB History    No data available     Review of Systems  All other systems reviewed and are negative.     Allergies  Review of patient's allergies indicates no known allergies.  Home Medications   Prior to Admission medications   Medication Sig Start Date End Date Taking? Authorizing Provider  acetaminophen (TYLENOL) 325 MG tablet Take 650 mg by mouth every 6 (six) hours as needed for headache.   Yes Historical Provider, MD  ibuprofen (ADVIL,MOTRIN) 200 MG tablet Take 200 mg by mouth every 6 (six) hours as needed for headache.   Yes Historical Provider, MD  rizatriptan (MAXALT-MLT) 5 MG disintegrating tablet Take 1 tablet (5 mg total) by mouth as needed. May repeat in 2 hours if needed Patient not taking:  Reported on 01/28/2016 01/19/16   [name], MD   BP 113/81 mmHg  Pulse 52  Temp(Src) 98.5 F (36.9 C) (Oral)  Resp 20  SpO2 99%  LMP 01/19/2016 Physical Exam  Constitutional: She is oriented to person, place, and time. She appears well-developed and well-nourished. No distress.  HENT:  Head: Normocephalic and atraumatic.  Eyes: EOM are normal.  Neck: Normal range of motion.  Cardiovascular: Normal rate, regular rhythm and normal heart sounds.   Pulmonary/Chest: Effort normal and breath sounds normal.  Abdominal: Soft. She exhibits no distension. There is no tenderness.  Musculoskeletal: Normal range of motion.  Neurological: She is alert and oriented to person, place, and time.  Skin: Skin is warm and dry.  Psychiatric: She has a normal mood and affect. Judgment normal.  Nursing note and vitals reviewed.   ED Course  Procedures (including critical care time) Labs Review Labs Reviewed  I-STAT CHEM 8, ED - Abnormal; Notable for the following:    Potassium 3.0 (*)    BUN 4 (*)    Hemoglobin 11.6 (*)    HCT 34.0 (*)    All other components within normal limits    Imaging Review No results found. I have personally reviewed and evaluated these images and  lab results as part of my medical decision-making.   EKG Interpretation None      MDM   Final diagnoses:  Headache, unspecified headache type    6:20 AM Patient feels much better at this time.  Discharge home in good condition.  Primary care and neurology follow-up.  Resolution of symptoms.  Normal neurologic exam.  No indication for imaging.    Azalia Bilis, MD 01/28/16 [phone]

## 2016-01-28 NOTE — ED Notes (Signed)
Called pt multiple times in the waiting room.  Was able to go over and wake pt up on bench.

## 2016-01-28 NOTE — Discharge Instructions (Signed)

## 2016-01-28 NOTE — ED Notes (Signed)
The pt has had a headache for 3-4 weeks.  She has been seen here in the ed and she has seen a neurologist for the same she reports that she is afraid she has an aneurysm.  She does not want benadryl or percocet it just makes her sleepy  lmp last week

## 2016-01-28 NOTE — ED Notes (Signed)
Pt stable, ambulatory, states understanding of discharge instructions 

## 2016-01-28 NOTE — ED Notes (Signed)
Unable to locate pt. at waiting area several times.  

## 2016-02-09 ENCOUNTER — Encounter: Payer: Self-pay | Admitting: Neurology

## 2016-02-21 ENCOUNTER — Encounter: Payer: Self-pay | Admitting: Adult Health

## 2016-02-21 ENCOUNTER — Ambulatory Visit: Payer: Self-pay | Admitting: Adult Health

## 2016-02-21 ENCOUNTER — Ambulatory Visit (INDEPENDENT_AMBULATORY_CARE_PROVIDER_SITE_OTHER): Payer: Self-pay | Admitting: Adult Health

## 2016-02-21 VITALS — BP 142/80 | HR 76 | Resp 20 | Ht 66.0 in | Wt 146.0 lb

## 2016-02-21 DIAGNOSIS — R51 Headache: Secondary | ICD-10-CM

## 2016-02-21 DIAGNOSIS — G4484 Primary exertional headache: Secondary | ICD-10-CM

## 2016-02-21 MED ORDER — INDOMETHACIN 25 MG PO CAPS: ORAL_CAPSULE | ORAL | Status: DC

## 2016-02-21 NOTE — Patient Instructions (Signed)
MRI brain If your symptoms worsen or you develop new symptoms please let us know.   

## 2016-02-21 NOTE — Progress Notes (Signed)
I have reviewed and agreed above plan. 

## 2016-02-21 NOTE — Progress Notes (Signed)
PATIENT: Cynthia Arroyo DOB: [birth year]  REASON FOR VISIT: follow up- headache HISTORY FROM: patient  HISTORY OF PRESENT ILLNESS: Today 02/21/2016: Is [name] is a 25 year old female with a history of headaches. She returns today for follow-up. She reports that Maxalt has given her no benefit for headache. She states that she  has a headache daily. She has noticed that her headache only occurs with exertional activity. The patient plays semi-pro basketball. She states in her practices when she starts to run that is when her headache starts. She describes it as a throbbing sensation that circles the entire head. She denies photophobia and phonophobia. Occasionally she will have nausea. The patient does state that during the onset of the headache she will become very lightheaded and dizzy. She states that she has also noticed that when she carries groceries up to her third-floor apartment this can also cause headache. She states that prior to the headaches she could carry all of her groceries up 3 floors with no difficulty. She returns today for an evaluation.  HISTORY Terrace Arabia): [name] is a 25 years old right-handed African American female, referred to by her primary care physician nurse practitioner Andrew Au, for evaluation of bilateral feet, and fingertips paresthesia, She is accompanied by her friend Nona Dell at today's clinical visit.  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,, packaging, she began to complains of bilateral hands paresthesia since 2014, involving all 5 fingers, right worse than left, also a radiating from her fingers towards the right arm, to her right shoulder, towards her right neck  She also complains of few months history of bilateral feet paresthesia, she has no low back pain, no significant neck pain  She denies bowel and bladder incontinence  UPDATE March 2nd 2017: I saw her previously for paresthesia which has  improved, last visit was in 2015, Today she came in with new onset headache since Feb 2017, severe holocranial pounding headaches, with associated light noise sensitivity, nauseous, lasting for few hours, with light noise sensitivity, she tried over-the-counter Excedrin Migraine, which only helped her temporarily. She denies visual loss, she denies family history of migraine, she has practiced basketball regularly, no lateralized motor or sensory deficit.    REVIEW OF SYSTEMS: Out of a complete 14 system review of symptoms, the patient complains only of the following symptoms, and all other reviewed systems are negative.  ALLERGIES: No Known Allergies  HOME MEDICATIONS: Outpatient Prescriptions Prior to Visit  Medication Sig Dispense Refill  . acetaminophen (TYLENOL) 325 MG tablet Take 650 mg by mouth every 6 (six) hours as needed for headache. Reported on 02/21/2016    . ibuprofen (ADVIL,MOTRIN) 200 MG tablet Take 200 mg by mouth every 6 (six) hours as needed for headache. Reported on 02/21/2016    . rizatriptan (MAXALT-MLT) 5 MG disintegrating tablet Take 1 tablet (5 mg total) by mouth as needed. May repeat in 2 hours if needed (Patient not taking: Reported on 02/21/2016) 12 tablet 6   No facility-administered medications prior to visit.    PAST MEDICAL HISTORY: Past Medical History  Diagnosis Date  . High cholesterol   . Carpal tunnel syndrome     Left  . Headache     PAST SURGICAL HISTORY: Past Surgical History  Procedure Laterality Date  . None      FAMILY HISTORY: Family History  Problem Relation Age of Onset  . Diabetes Paternal Grandfather   . Diabetes Maternal Grandmother   .  Asthma Brother   . High blood pressure Father   . Healthy Mother     SOCIAL HISTORY: Social History   Social History  . Marital Status: Single    Spouse Name: N/A  . Number of Children: 0  . Years of Education: college   Occupational History  .      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 Smoking status: Never Smoker   . Smokeless tobacco: Never Used  . Alcohol Use: 1.2 oz/week    2 Cans of beer per week     Comment: Social  . Drug Use: Yes    Special: Marijuana     Comment: Marijuana use  . Sexual Activity: Not on file   Other Topics Concern  . 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   Education college.   Right handed.   Caffeine none.       PHYSICAL EXAM  Filed Vitals:   02/21/16 1159  BP: 142/80  Pulse: 76  Resp: 20  Height:  (1.676 m)  Weight: 146 lb (66.225 kg)   Body mass index is 23.58 kg/(m^2).  Generalized: Well developed, in no acute distress   Neurological examination  Mentation: Alert oriented to time, place, history taking. Follows all commands speech and language fluent Cranial nerve II-XII: Pupils were equal round reactive to light. Extraocular movements were full, visual field were full on confrontational test. Facial sensation and strength were normal. Uvula tongue midline. Head turning and shoulder shrug  were normal and symmetric. Motor: The motor testing reveals 5 over 5 strength of all 4 extremities. Good symmetric motor tone is noted throughout.  Sensory: Sensory testing is intact to soft touch on all 4 extremities. No evidence of extinction is noted.  Coordination: Cerebellar testing reveals good finger-nose-finger and heel-to-shin bilaterally.  Gait and station: Gait is normal. Tandem gait is normal. Romberg is negative. No drift is seen.  Reflexes: Deep tendon reflexes are symmetric and normal bilaterally.   DIAGNOSTIC DATA (LABS, IMAGING, TESTING) - I reviewed patient records, labs, notes, testing and imaging myself where available.  Lab Results  Component Value Date   WBC 5.2 01/11/2016   HGB 11.6* 01/28/2016   HCT 34.0* 01/28/2016   MCV 77.3* 01/11/2016   PLT 238 01/11/2016      Component Value Date/Time   NA 141  01/28/2016 0517   NA 141 04/06/2014 0916   K 3.0* 01/28/2016 0517   CL 103 01/28/2016 0517   CO2 26 01/11/2016 2232   GLUCOSE 95 01/28/2016 0517   GLUCOSE 82 04/06/2014 0916   BUN 4* 01/28/2016 0517   BUN 8 04/06/2014 0916   CREATININE 0.60 01/28/2016 0517   CALCIUM 9.2 01/11/2016 2232   PROT 6.6 04/06/2014 0916   ALBUMIN 4.3 04/06/2014 0916   AST 33 04/06/2014 0916   ALT 21 04/06/2014 0916   ALKPHOS 57 04/06/2014 0916   BILITOT 0.2 04/06/2014 0916   GFRNONAA >60 01/11/2016 2232   GFRAA >60 01/11/2016 2232       ASSESSMENT AND PLAN 25 y.o. year old female  has a past medical history of High cholesterol; Carpal tunnel syndrome; and Headache. here with:  1. Exertional headache  The patient reports  headache primarily with exertion. Her physical exam is unremarkable. We will check an MRI/MRA of the head to look for any acute causes. The patient  will try taking indomethacin 25 mg 30-60 minutes prior to activity to prevent headaches. The patient will avoid strenuous activity/ exercise until after her scans. Patient is amenable to this plan. She will follow-up in 3-4 months or sooner if needed.  Butch PennyMegan [place], MSN, NP-C 02/21/2016, 1:17 PM [facility] [address], [address] [place], [place] [address] [phone]

## 2016-02-22 ENCOUNTER — Other Ambulatory Visit: Payer: Self-pay | Admitting: Adult Health

## 2016-02-22 DIAGNOSIS — G4484 Primary exertional headache: Secondary | ICD-10-CM

## 2016-02-29 ENCOUNTER — Telehealth: Payer: Self-pay

## 2016-02-29 MED ORDER — INDOMETHACIN 25 MG PO CAPS: ORAL_CAPSULE | ORAL | Status: AC

## 2016-02-29 NOTE — Telephone Encounter (Signed)
Sig adjusted and sent to [facility].

## 2016-02-29 NOTE — Telephone Encounter (Signed)
Pt's [facility] pharmacy is requesting clarification on the indocin RX. How many capsules should pt take? Current sig is "Take 30-60 minutes before exertional activity."  I assume it is just one capsule, but they sig needs to be changed to reflect this.

## 2016-05-22 ENCOUNTER — Emergency Department (HOSPITAL_COMMUNITY)
Admission: EM | Admit: 2016-05-22 | Discharge: 2016-05-22 | Disposition: A | Discharge status: Home / Self Care | Payer: Self-pay | Attending: Emergency Medicine | Admitting: Emergency Medicine

## 2016-05-22 ENCOUNTER — Encounter (HOSPITAL_COMMUNITY): Payer: Self-pay | Admitting: Emergency Medicine

## 2016-05-22 ENCOUNTER — Emergency Department (HOSPITAL_COMMUNITY): Payer: Self-pay

## 2016-05-22 DIAGNOSIS — Y929 Unspecified place or not applicable: Secondary | ICD-10-CM | POA: Insufficient documentation

## 2016-05-22 DIAGNOSIS — Y999 Unspecified external cause status: Secondary | ICD-10-CM | POA: Insufficient documentation

## 2016-05-22 DIAGNOSIS — W230XXA Caught, crushed, jammed, or pinched between moving objects, initial encounter: Secondary | ICD-10-CM | POA: Insufficient documentation

## 2016-05-22 DIAGNOSIS — Y9367 Activity, basketball: Secondary | ICD-10-CM | POA: Insufficient documentation

## 2016-05-22 DIAGNOSIS — Z79899 Other long term (current) drug therapy: Secondary | ICD-10-CM | POA: Insufficient documentation

## 2016-05-22 DIAGNOSIS — S63602A Unspecified sprain of left thumb, initial encounter: Secondary | ICD-10-CM | POA: Insufficient documentation

## 2016-05-22 NOTE — Progress Notes (Signed)
Orthopedic Tech Progress Note Patient Details:  Cynthia HarmsSakina Arroyo 1991/11/13 [id number]  Ortho Devices Type of Ortho Device: Thumb velcro splint Ortho Device/Splint Location: lue Ortho Device/Splint Interventions: Application   Cynthia Arroyo 05/22/2016, 5:05 PM

## 2016-05-22 NOTE — ED Notes (Signed)
Patient Alert and oriented X4. Stable and ambulatory. Patient verbalized understanding of the discharge instructions.  Patient belongings were taken by the patient. [facility]

## 2016-05-22 NOTE — ED Provider Notes (Signed)
CSN: [id number]     Arrival date & time 05/22/16  1531 History  By signing my name below, I, Soijett Blue, attest that this documentation has been prepared under the direction and in the presence of Kerrie BuffaloHope Neese, NP Electronically Signed: Soijett Blue, ED Scribe. 05/22/2016. 4:40 PM.   Chief Complaint  Patient presents with  . Finger Injury     Patient is a 25 y.o. female presenting with hand injury. The history is provided by the patient. No language interpreter was used.  Hand Injury Location:  Finger Injury: yes   Mechanism of injury comment:  Jammed left thumb while playing basketball Finger location:  L thumb Pain details:    Quality:  Unable to specify   Radiates to:  Does not radiate   Severity:  Mild   Onset quality:  Sudden   Duration:  5 days   Timing:  Constant   Progression:  Unchanged Chronicity:  New Dislocation: no   Foreign body present:  No foreign bodies Tetanus status:  Unknown Prior injury to area:  Yes Relieved by:  None tried Worsened by:  Movement Ineffective treatments:  None tried Associated symptoms: swelling   Associated symptoms: no decreased range of motion     Cynthia Arroyo is a 25 y.o. female who presents to the Emergency Department complaining of finger injury occurring 5 days ago. Pt notes that she was playing basketball when she jammed her left thumb. Pt reports that her left thumb pain is exacerbated with movement and denies any alleviating factors. Pt states that since the initial incident, she has accidentally struck the left thumb multiple times. Pt notes that she has fractured her left thumb when she was younger. Pt is having associated symptoms of left thumb pain. She notes that she has not tried any medications for the relief of her symptoms. She denies color change, wound, rash, and any other symptoms.   Past Medical History  Diagnosis Date  . High cholesterol   . Carpal tunnel syndrome     Left  . Headache    Past Surgical History   Procedure Laterality Date  . None     Family History  Problem Relation Age of Onset  . Diabetes Paternal Grandfather   . Diabetes Maternal Grandmother   . Asthma Brother   . High blood pressure Father   . Healthy Mother    Social History  Substance Use Topics  . Smoking status: Never Smoker   . Smokeless tobacco: Never Used  . Alcohol Use: 1.2 oz/week    2 Cans of beer per week     Comment: Social   OB History    No data available     Review of Systems  Musculoskeletal: Positive for joint swelling (left thumb) and arthralgias (left thumb).  Skin: Negative for color change, rash and wound.  All other systems reviewed and are negative.     Allergies  Review of patient's allergies indicates no known allergies.  Home Medications   Prior to Admission medications   Medication Sig Start Date End Date Taking? Authorizing Provider  acetaminophen (TYLENOL) 325 MG tablet Take 650 mg by mouth every 6 (six) hours as needed for headache. Reported on 02/21/2016    Historical Provider, MD  ibuprofen (ADVIL,MOTRIN) 200 MG tablet Take 200 mg by mouth every 6 (six) hours as needed for headache. Reported on 02/21/2016    Historical Provider, MD  indomethacin (INDOCIN) 25 MG capsule Take 1 capsule 30-60 minutes before exertional activity 02/29/16  [name], NP  rizatriptan (MAXALT-MLT) 5 MG disintegrating tablet Take 1 tablet (5 mg total) by mouth as needed. May repeat in 2 hours if needed Patient not taking: Reported on 02/21/2016 01/19/16   [name], MD   BP 104/79 mmHg  Pulse 71  Temp(Src) 97.9 F (36.6 C) (Oral)  Resp 16  SpO2 95% Physical Exam  Constitutional: She is oriented to person, place, and time. She appears well-developed and well-nourished. No distress.  HENT:  Head: Normocephalic and atraumatic.  Eyes: EOM are normal.  Neck: Neck supple.  Cardiovascular: Normal rate.   Pulses:      Radial pulses are 2+ on the left side.  Radial pulse 2+  Pulmonary/Chest: Effort  normal. No respiratory distress.  Abdominal: She exhibits no distension.  Musculoskeletal: Normal range of motion.       Left hand: She exhibits tenderness and swelling.  Tenderness with palpation and ROM of left thumb. Swelling noted to thenar area. Adequate cap refill.   Neurological: She is alert and oriented to person, place, and time.  Skin: Skin is warm and dry.  Psychiatric: She has a normal mood and affect. Her behavior is normal.  Nursing note and vitals reviewed.   ED Course  Procedures (including critical care time) DIAGNOSTIC STUDIES: Oxygen Saturation is 95% on RA, adequate by my interpretation.    COORDINATION OF CARE: 4:40 PM Discussed treatment plan with pt at bedside which includes left thumb xray and pt agreed to plan.  Thumb spica splint, ice, ibuprofen and f/u with ortho,  Discussed with the patient possible ligament injury.      Imaging Review Dg Finger Thumb Left  05/22/2016  CLINICAL DATA:  Jammed left thumb playing basketball 5 days ago EXAM: LEFT THUMB 2+V COMPARISON:  08/25/2012 FINDINGS: Three views of the left thumb submitted. No acute fracture or subluxation. No radiopaque foreign body. IMPRESSION: Negative. Electronically Signed   By: Natasha MeadLiviu  Pop M.D.   On: 05/22/2016 16:06    MDM   Final diagnoses:  Thumb sprain, left, initial encounter    Patient X-Ray negative for obvious fracture or dislocation.  Pt advised to follow up with orthopedics. Patient given splint while in ED, conservative therapy recommended and discussed. Patient will be discharged home & is agreeable with above plan. Returns precautions discussed. Pt appears safe for discharge.  I personally performed the services described in this documentation, which was scribed in my presence. The recorded information has been reviewed and is accurate.    [address] [employer] [place] Ne[place], NP 05/22/16 1703  Leta BaptistEmily Roe Nguyen, MD 05/24/16 [phone]

## 2016-05-22 NOTE — ED Notes (Signed)
Pt sts left thumb pain after hurting playing basketball

## 2016-05-23 ENCOUNTER — Ambulatory Visit: Payer: Self-pay | Admitting: Adult Health

## 2016-05-24 ENCOUNTER — Encounter: Payer: Self-pay | Admitting: Adult Health

## 2016-10-01 ENCOUNTER — Emergency Department (HOSPITAL_COMMUNITY)
Admission: EM | Admit: 2016-10-01 | Discharge: 2016-10-01 | Disposition: A | Discharge status: Home / Self Care | Payer: Self-pay | Attending: Emergency Medicine | Admitting: Emergency Medicine

## 2016-10-01 ENCOUNTER — Encounter (HOSPITAL_COMMUNITY): Payer: Self-pay | Admitting: *Deleted

## 2016-10-01 DIAGNOSIS — H109 Unspecified conjunctivitis: Secondary | ICD-10-CM | POA: Insufficient documentation

## 2016-10-01 MED ORDER — POLYMYXIN B-TRIMETHOPRIM 10000-0.1 UNIT/ML-% OP SOLN
1.0000 [drp] | OPHTHALMIC | Status: DC
  Administered 2016-10-01: 1 [drp] via OPHTHALMIC
  Filled 2016-10-01: qty 10

## 2016-10-01 NOTE — ED Provider Notes (Signed)
[facility]-EMERGENCY DEPT Provider Note   CSN: [id number] Arrival date & time: 10/01/16  1652  By signing my name below, I, Cynthia Arroyo, attest that this documentation has been prepared under the direction and in the presence of  Keayra Graham, PA-C. Electronically Signed: Clovis PuAvnee Arroyo, ED Scribe. 10/01/16. 5:45 PM.   History   Chief Complaint Chief Complaint  Patient presents with  . Conjunctivitis    The history is provided by the patient. No language interpreter was used.   HPI Comments:  Cynthia Arroyo is a 25 y.o. female who presents to the Emergency Department complaining of sudden onset, worsening bilateral eye redness x 1 week. Pt notes associated eye itching/pain and eye drainage. States her wife and her child have same sympoms.  She has used her wife's antibacterial eye drops for 2 days with no relief. No exacerbating factors noted. Pt denies fever, rhinorrhea, sore throat, cough, eye contact and eye glasses use. Pt denies any other symptoms or complaints at this time.  Past Medical History:  Diagnosis Date  . Carpal tunnel syndrome    Left  . Headache   . High cholesterol     Patient Active Problem List   Diagnosis Date Noted  . Migraine 01/19/2016  . Paresthesia     Past Surgical History:  Procedure Laterality Date  . None      OB History    No data available       Home Medications    Prior to Admission medications   Medication Sig Start Date End Date Taking? Authorizing Provider  acetaminophen (TYLENOL) 325 MG tablet Take 650 mg by mouth every 6 (six) hours as needed for headache. Reported on 02/21/2016    Historical Provider, MD  ibuprofen (ADVIL,MOTRIN) 200 MG tablet Take 200 mg by mouth every 6 (six) hours as needed for headache. Reported on 02/21/2016    Historical Provider, MD  indomethacin (INDOCIN) 25 MG capsule Take 1 capsule 30-60 minutes before exertional activity 02/29/16   [name], NP  rizatriptan (MAXALT-MLT) 5 MG disintegrating tablet  Take 1 tablet (5 mg total) by mouth as needed. May repeat in 2 hours if needed Patient not taking: Reported on 02/21/2016 01/19/16   [name], MD    Family History Family History  Problem Relation Age of Onset  . High blood pressure Father   . Healthy Mother   . Diabetes Paternal Grandfather   . Diabetes Maternal Grandmother   . Asthma Brother     Social History Social History  Substance Use Topics  . Smoking status: Never Smoker  . Smokeless tobacco: Never Used  . Alcohol use 1.2 oz/week    2 Cans of beer per week     Comment: Social     Allergies   Patient has no known allergies.   Review of Systems Review of Systems  Constitutional: Negative for fever.  HENT: Negative for rhinorrhea and sore throat.   Eyes: Positive for pain, discharge, redness and itching. Negative for visual disturbance.  Respiratory: Negative for cough.      Physical Exam Updated Vital Signs BP 133/63 (BP Location: Left Arm)   Pulse 85   Temp 98.2 F (36.8 C) (Oral)   Resp 16   Ht 5\' 6"  (1.676 m)   Wt 145 lb (65.8 kg)   LMP 08/31/2016 (Approximate)   SpO2 100%   BMI 23.40 kg/m   Physical Exam  Constitutional: She is oriented to person, place, and time. She appears well-developed and well-nourished. No distress.  HENT:  Head: Normocephalic and atraumatic.  Right Ear: External ear normal.  Left Ear: External ear normal.  Nose: Nose normal.  Mouth/Throat: Oropharynx is clear and moist.  Eyes:  Bilateral conjunctival injection. PERRLA. Normal lids. No foreign body. Normal extra ocular movements. No photophobia.  Neck: Neck supple.  Cardiovascular: Normal rate.   Pulmonary/Chest: Effort normal.  Abdominal: She exhibits no distension.  Neurological: She is alert and oriented to person, place, and time.  Skin: Skin is warm and dry.  Psychiatric: She has a normal mood and affect.  Nursing note and vitals reviewed.  ED Treatments / Results  DIAGNOSTIC STUDIES:  Oxygen Saturation is  100% on RA, normal by my interpretation.    COORDINATION OF CARE:  5:40 PM Discussed treatment plan with pt at bedside and pt agreed to plan.  Labs (all labs ordered are listed, but only abnormal results are displayed) Labs Reviewed - No data to display  EKG  EKG Interpretation None       Radiology No results found.  Procedures Procedures (including critical care time)  Medications Ordered in ED Medications  trimethoprim-polymyxin b (POLYTRIM) ophthalmic solution 1 drop (not administered)     Initial Impression / Assessment and Plan / ED Course  I have reviewed the triage vital signs and the nursing notes.  Pertinent labs & imaging results that were available during my care of the patient were reviewed by me and considered in my medical decision making (see chart for details).  Clinical Course     Patient presentation consistent with conjunctivitis.  Normal visual acuity. Pt does not wear contacts. Doubt corneal abrasion, ulcers, herpes keratitis, uveitis given symptoms in both eyes and her baby and significant other has same symptoms.   Pt discharged with polytrim drops.  Personal hygiene and frequent handwashing discussed.  Patient advised to follow up with ophthalmologist if symptoms persist or worsen. Return precautions discussed.  Patient verbalizes understanding and is agreeable with discharge.  Visual Acuity   Row Name  10/01/16 17:29:44          Visual Acuity  Bilateral Near            Bilateral Distance  20/20          R Near            R Distance  20/20          L Near            L Distance  30/20           Vitals:   10/01/16 1713 10/01/16 1723  BP:  133/63  Pulse:  85  Resp:  16  Temp:  98.2 F (36.8 C)  TempSrc:  Oral  SpO2:  100%  Weight: 65.8 kg   Height: 5\' 6"  (1.676 m)      Final Clinical Impressions(s) / ED Diagnoses   Final diagnoses:  Conjunctivitis of both eyes, unspecified conjunctivitis type    New Prescriptions New  Prescriptions   No medications on file    I personally performed the services described in this documentation, which was scribed in my presence. The recorded information has been reviewed and is accurate.    [name], PA-C 10/01/16 1804    Jacalyn LefevreJulie Haviland, MD 10/01/16 [phone]

## 2016-10-01 NOTE — ED Triage Notes (Signed)
Pt here with bilateral eye redness, pain and drainage x1 week.  Her wife had same before and was treated with antibiotic eye ointment.

## 2016-10-01 NOTE — ED Notes (Signed)
Pt is here with her child and would like the child evaluated first

## 2016-10-01 NOTE — ED Notes (Signed)
Patient waiting with child in peds who is also a patient.

## 2016-10-01 NOTE — Discharge Instructions (Signed)
Apply drops, 1 drop in each eye every 3-4 hrs for 7 days. Wash your hands frequently. Avoid rubbing your eyes. Follow up with ophthalmology if not improving.

## 2016-12-17 ENCOUNTER — Ambulatory Visit (INDEPENDENT_AMBULATORY_CARE_PROVIDER_SITE_OTHER): Payer: BLUE CROSS/BLUE SHIELD | Admitting: Podiatry

## 2016-12-17 ENCOUNTER — Encounter: Payer: Self-pay | Admitting: Podiatry

## 2016-12-17 ENCOUNTER — Ambulatory Visit: Payer: PRIVATE HEALTH INSURANCE | Admitting: Podiatry

## 2016-12-17 DIAGNOSIS — L600 Ingrowing nail: Secondary | ICD-10-CM | POA: Diagnosis not present

## 2016-12-17 DIAGNOSIS — L03039 Cellulitis of unspecified toe: Secondary | ICD-10-CM | POA: Diagnosis not present

## 2016-12-17 DIAGNOSIS — M79676 Pain in unspecified toe(s): Secondary | ICD-10-CM | POA: Diagnosis not present

## 2016-12-28 NOTE — Progress Notes (Signed)
   Subjective: Patient presents today for evaluation of pain in toe(s). Patient is concerned for possible ingrown nail. Patient states that the pain has been present for a few weeks now. Patient presents today for further treatment and evaluation.  Objective:  General: Well developed, nourished, in no acute distress, alert and oriented x3   Dermatology: Skin is warm, dry and supple bilateral. Lateral border of the fifth digit left foot appears to be erythematous with evidence of an ingrowing nail. Pain on palpation noted to the border of the nail fold. The remaining nails appear unremarkable at this time. There are no open sores, lesions.  Vascular: Dorsalis Pedis artery and Posterior Tibial artery pedal pulses palpable. No lower extremity edema noted.   Neruologic: Grossly intact via light touch bilateral.  Musculoskeletal: Muscular strength within normal limits in all groups bilateral. Normal range of motion noted to all pedal and ankle joints.   Assesement: #1 Paronychia with ingrowing nail lateral border fifth digit left foot #2 Pain in toe #3 Incurvated nail  Plan of Care:  1. Patient evaluated.  2. Discussed treatment alternatives and plan of care. Explained nail avulsion procedure and post procedure course to patient. 3. Patient opted for permanent partial nail avulsion.  4. Prior to procedure, local anesthesia infiltration utilized using 3 ml of a 50:50 mixture of 2% plain lidocaine and 0.5% plain marcaine in a normal hallux block fashion and a betadine prep performed.  5. Partial permanent nail avulsion with chemical matrixectomy performed using 3x30sec applications of phenol followed by alcohol flush.  6. Light dressing applied. 7. Return to clinic in 2 weeks.   Felecia ShellingBrent M. Evans, DPM [facility]  Dr. Felecia ShellingBrent M. Evans, DPM    [address]                                        [place], [place] [address]                Office [phone]  Fax [phone]  [phone]

## 2016-12-31 ENCOUNTER — Ambulatory Visit (INDEPENDENT_AMBULATORY_CARE_PROVIDER_SITE_OTHER): Payer: BLUE CROSS/BLUE SHIELD | Admitting: Podiatry

## 2016-12-31 DIAGNOSIS — M79676 Pain in unspecified toe(s): Secondary | ICD-10-CM

## 2016-12-31 DIAGNOSIS — S91109D Unspecified open wound of unspecified toe(s) without damage to nail, subsequent encounter: Secondary | ICD-10-CM | POA: Diagnosis not present

## 2016-12-31 DIAGNOSIS — S91209D Unspecified open wound of unspecified toe(s) with damage to nail, subsequent encounter: Secondary | ICD-10-CM | POA: Diagnosis not present

## 2016-12-31 NOTE — Progress Notes (Signed)
   Subjective: Patient presents today 2 weeks post ingrown nail permanent nail avulsion procedure. Patient states that the toe and nail fold is feeling much better.  Objective: Skin is warm, dry and supple. Nail and respective nail fold appears to be healing appropriately. Open wound to the associated nail fold with a granular wound base and moderate amount of fibrotic tissue. Minimal drainage noted. Mild erythema around the periungual region likely due to phenol chemical matricectomy.  Assessment: #1 postop permanent partial nail avulsion lateral border fifth digit left foot #2 open wound periungual nail fold of respective digit.   Plan of care: #1 patient was evaluated  #2 debridement of open wound was performed to the periungual border of the respective toe using a currette. Antibiotic ointment and Band-Aid was applied. #3 patient is to return to clinic on a PRN  basis.   Felecia ShellingBrent M. Emrik Erhard, DPM [facility]  Dr. Felecia ShellingBrent M. Naomy Esham, DPM    [address]                                        [place], [place] [address]                Office [phone]  Fax [phone]) [phone]

## 2018-03-19 IMAGING — DX DG FINGER THUMB 2+V*L*
3 series · 3 of 3 positions shown · non-contrast
Comparison: 08/25/2012

CLINICAL DATA: Jammed left thumb playing basketball 5 days ago

EXAM:
LEFT THUMB 2+V

[finger ap]
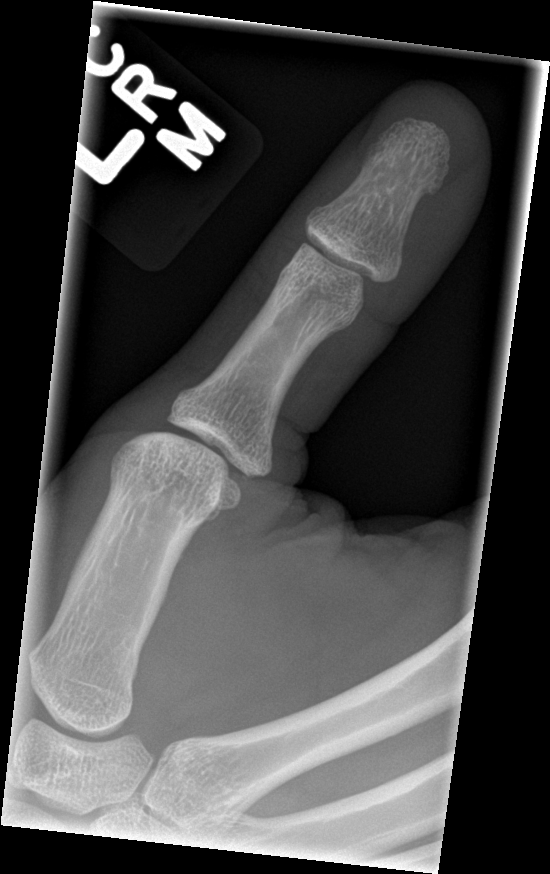

[finger obl]
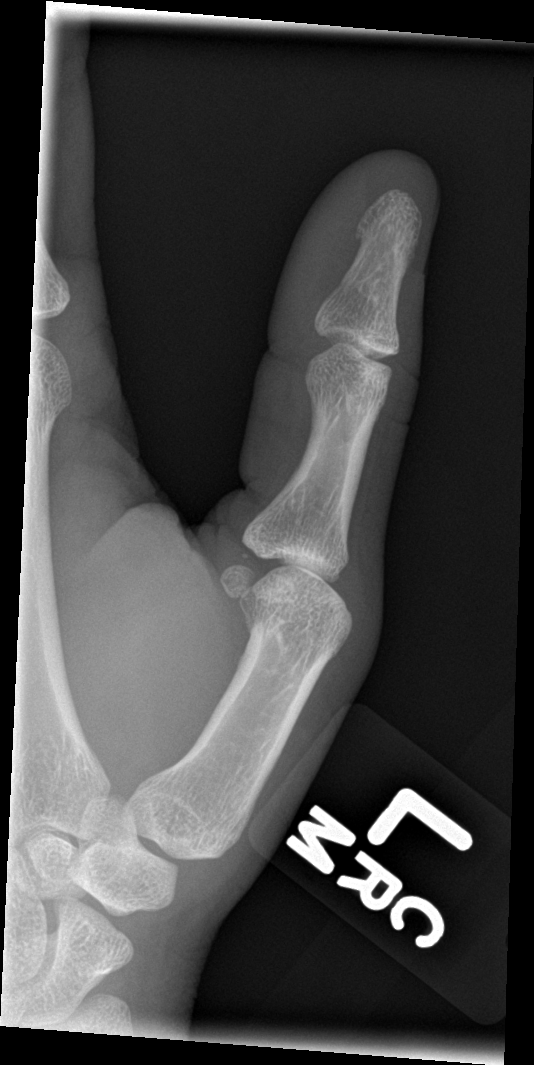

[finger lat]
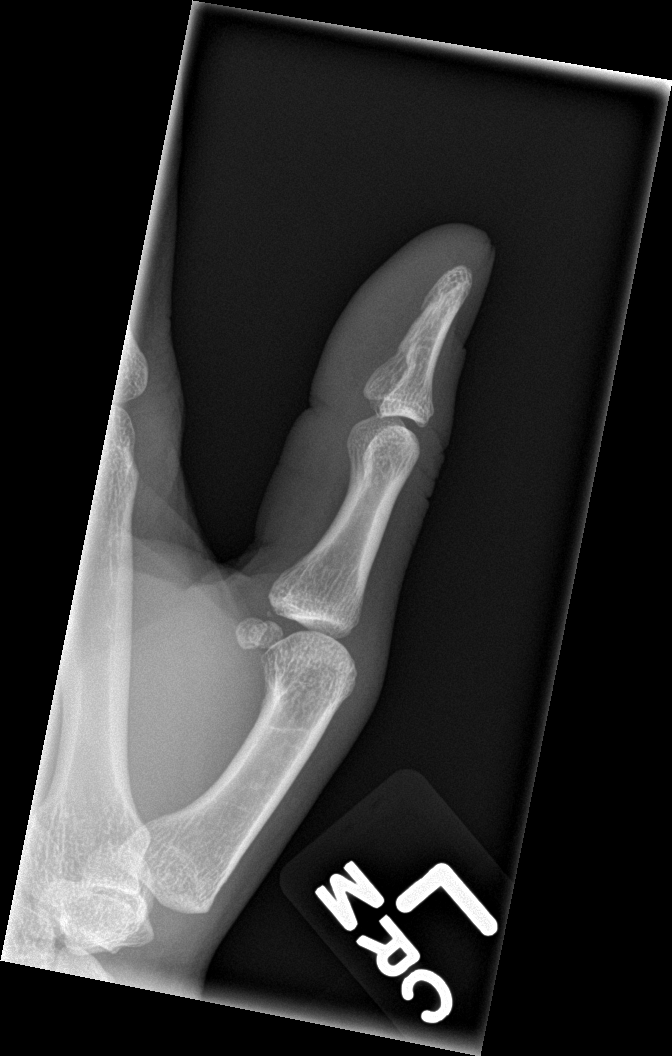

[3 of 3 positions shown; findings below may reference images not displayed]

FINDINGS: Three views of the left thumb submitted. No acute fracture or
subluxation. No radiopaque foreign body.
IMPRESSION: Negative.

## 2018-08-30 ENCOUNTER — Emergency Department (HOSPITAL_COMMUNITY): Payer: Self-pay

## 2018-08-30 ENCOUNTER — Emergency Department (HOSPITAL_COMMUNITY)
Admission: EM | Admit: 2018-08-30 | Discharge: 2018-08-30 | Disposition: A | Discharge status: Home / Self Care | Payer: Self-pay | Attending: Emergency Medicine | Admitting: Emergency Medicine

## 2018-08-30 ENCOUNTER — Other Ambulatory Visit: Payer: Self-pay

## 2018-08-30 DIAGNOSIS — R0789 Other chest pain: Secondary | ICD-10-CM | POA: Insufficient documentation

## 2018-08-30 LAB — BASIC METABOLIC PANEL
ANION GAP: 10 (ref 5–15)
BUN: 8 mg/dL (ref 6–20)
CHLORIDE: 101 mmol/L (ref 98–111)
CO2: 25 mmol/L (ref 22–32)
Calcium: 9.5 mg/dL (ref 8.9–10.3)
Creatinine, Ser: 0.69 mg/dL (ref 0.44–1.00)
GFR calc Af Amer: >60 mL/min (ref >60)
GLUCOSE: 75 mg/dL (ref 70–99)
POTASSIUM: 3.7 mmol/L (ref 3.5–5.1)
Sodium: 136 mmol/L (ref 135–145)

## 2018-08-30 LAB — I-STAT BETA HCG BLOOD, ED (MC, WL, AP ONLY): I-stat hCG, quantitative: <5 m[IU]/mL (ref <5)

## 2018-08-30 LAB — CBC
HEMATOCRIT: 38.2 % (ref 36.0–46.0)
HEMOGLOBIN: 11.8 g/dL — AB (ref 12.0–15.0)
MCH: 26.2 pg (ref 26.0–34.0)
MCHC: 30.9 g/dL (ref 30.0–36.0)
MCV: 84.7 fL (ref 80.0–100.0)
Platelets: 342 10*3/uL (ref 150–400)
RBC: 4.51 MIL/uL (ref 3.87–5.11)
RDW: 13.4 % (ref 11.5–15.5)
WBC: 3.9 10*3/uL — AB (ref 4.0–10.5)
nRBC: 0 % (ref 0.0–0.2)

## 2018-08-30 LAB — I-STAT TROPONIN, ED: TROPONIN I, POC: 0 ng/mL (ref 0.00–0.08)

## 2018-08-30 NOTE — ED Provider Notes (Signed)
[facility] EMERGENCY DEPARTMENT Provider Note   CSN: [id number] Arrival date & time: 08/30/18  1820     History   Chief Complaint Chief Complaint  Patient presents with  . Chest Pain    HPI Cynthia Arroyo is a 27 y.o. female.   27 y.o female with a PMH of high cholesterol presents to the ED with a chief complaint of chest pain x 3 weeks. Patient reports the pain is mainly located on the left side of her chest with no radiation, she describes the pain as intermittent pressure without aggravating symptoms or relieving symptoms.  Rates this pain a 5 out of 10, she has also not tried any medical therapy for her pain.  Patient also reports she sometimes gets lightheaded, dizzy and needs to sit down for the symptoms to resolve.  Patient denies any previous history of heart disease, fever, blood clots, cough, sore throat or shortness of breath.  Reports she is been grueling images on the Internet and states that she read she could be having a heart attack and decided to come in.  He reports a chronic left arm numbness which she has been dealing with for years and sleeps with a brace to help relieve her symptoms.  She has not been seen by a primary care physician in years as she recently moved.  Denies any abdominal pain, urinary symptoms, headache.          Past Medical History:  Diagnosis Date  . Carpal tunnel syndrome    Left  . Headache   . High cholesterol     Patient Active Problem List   Diagnosis Date Noted  . Migraine 01/19/2016  . Paresthesia     Past Surgical History:  Procedure Laterality Date  . None       OB History   None      Home Medications    Prior to Admission medications   Medication Sig Start Date End Date Taking? Authorizing Provider  acetaminophen (TYLENOL) 325 MG tablet Take 650 mg by mouth every 6 (six) hours as needed for headache. Reported on 02/21/2016    [provider]  ibuprofen (ADVIL,MOTRIN) 200 MG tablet  Take 200 mg by mouth every 6 (six) hours as needed for headache. Reported on 02/21/2016    [provider]  indomethacin (INDOCIN) 25 MG capsule Take 1 capsule 30-60 minutes before exertional activity 02/29/16   Butch Penny, NP  rizatriptan (MAXALT-MLT) 5 MG disintegrating tablet Take 1 tablet (5 mg total) by mouth as needed. May repeat in 2 hours if needed Patient not taking: Reported on 02/21/2016 01/19/16   Levert Feinstein, MD    Family History Family History  Problem Relation Age of Onset  . High blood pressure Father   . Healthy Mother   . Diabetes Paternal Grandfather   . Diabetes Maternal Grandmother   . Asthma Brother     Social History Social History   Tobacco Use  . Smoking status: Never Smoker  . Smokeless tobacco: Never Used  Substance Use Topics  . Alcohol use: Yes    Alcohol/week: 2.0 standard drinks    Types: 2 Cans of beer per week    Comment: Social  . Drug use: Yes    Types: Marijuana    Comment: Marijuana use     Allergies   Patient has no known allergies.   Review of Systems Review of Systems  Constitutional: Negative for chills and fever.  HENT: Negative for sinus pressure,  sinus pain and sore throat.   Respiratory: Negative for cough and shortness of breath.   Cardiovascular: Positive for chest pain.  Gastrointestinal: Negative for abdominal pain, diarrhea, nausea and vomiting.  Genitourinary: Negative for dysuria and flank pain.  Musculoskeletal: Negative for back pain.  Skin: Negative for pallor and wound.  Neurological: Positive for light-headedness. Negative for numbness and headaches.  All other systems reviewed and are negative.    Physical Exam Updated Vital Signs BP (!) 111/45   Pulse 84   Temp 98.5 F (36.9 C) (Oral)   Resp (!) 21   Ht 5\' 7"  (1.702 m)   Wt 72.6 kg   SpO2 100%   BMI 25.06 kg/m   Physical Exam  Constitutional: She is oriented to person, place, and time. She appears well-developed and well-nourished.    Lying in bed with computer on her lap.   HENT:  Head: Normocephalic and atraumatic.  Mouth/Throat: Uvula is midline and oropharynx is clear and moist. No posterior oropharyngeal edema or posterior oropharyngeal erythema. No tonsillar exudate.  Neck: Normal range of motion. Neck supple.  Cardiovascular: Normal rate.  Pulmonary/Chest: Effort normal and breath sounds normal. She has no decreased breath sounds. She has no wheezes.  Musculoskeletal:       Right lower leg: She exhibits no edema.       Left lower leg: She exhibits no edema.  Neurological: She is alert and oriented to person, place, and time.  Skin: Skin is warm and dry.  Nursing note and vitals reviewed.    ED Treatments / Results  Labs (all labs ordered are listed, but only abnormal results are displayed) Labs Reviewed  CBC - Abnormal; Notable for the following components:      Result Value   WBC 3.9 (*)    Hemoglobin 11.8 (*)    All other components within normal limits  BASIC METABOLIC PANEL  I-STAT TROPONIN, ED  I-STAT BETA HCG BLOOD, ED ([facility], [facility], [facility] ONLY)    EKG None  Radiology Dg Chest 2 View  Result Date: 08/30/2018 CLINICAL DATA:  Patient with left-sided chest pain and tightness. EXAM: CHEST - 2 VIEW COMPARISON:  None. FINDINGS: Normal cardiac and mediastinal contours. No consolidative pulmonary opacities. No pleural effusion or pneumothorax. Regional skeleton is unremarkable. IMPRESSION: No active cardiopulmonary disease. Electronically Signed   By: Annia Belt M.D.   On: 08/30/2018 19:23    Procedures Procedures (including critical care time)  Medications Ordered in ED Medications - No data to display   Initial Impression / Assessment and Plan / ED Course  I have reviewed the triage vital signs and the nursing notes.  Pertinent labs & imaging results that were available during my care of the patient were reviewed by me and considered in my medical decision making (see chart for details).     Presents with complaint of chest pain which began 3 weeks ago.  She has not tried any therapy for her symptoms.  CBC showed no leukocytosis, globin is stable compared to patient's previous visits.  BMP showed no electrolyte abnormality, creatinine is within normal ranges at 0.69.  First troponin was 0.00, negative.  Beta hCG was also negative.  DG chest 2 view showed no dilatation, pleural effusion, pneumothorax. She reports she is been reading on the inner that she could be having a heart attack and urged to come in, she reports she is been getting less sleep and this chest pain concerned her.  She also reports chronic left numbness  in her left arm which she has been wearing a brace for while she sleeps and has some relief.  Patient reports she also moved here recently has not been able to be seen by her primary care physician.  Based on patient's story along with symptoms ACS is less likely as her EKG was normal sinus rhythm and troponin was negative.  Discussing with patient she reports "I am sorry sure to come here I am distressed ".  At this time I have advised patient she needs to make an appointment with her primary care physician to establish care and have her get further work-up for her chest pains of they have been going on for 3 weeks.  Patient understands and agrees with plan.  Return precautions provided.  Final Clinical Impressions(s) / ED Diagnoses   Final diagnoses:  Atypical chest pain    ED Discharge Orders    None       Claude Manges, Cordelia Poche 08/30/18 2023    Azalia Bilis, MD 08/31/18 1747

## 2018-08-30 NOTE — ED Triage Notes (Signed)
L sided CP and tightness and palpitations for approx 3 weeks,  L arm and R fingertip numbness periodically throughout day and completely numb at night.  C/o nausea and lightheadness with chest pain, denies SOB

## 2018-08-30 NOTE — Discharge Instructions (Addendum)
His follow-up with your primary care physician as needed.  I have provided a referral to the [facility] health clinic who can see you to establish care.  If you experience any worsening symptoms, chest pain, shortness of breath please return to the ED for reevaluation.

## 2018-08-30 NOTE — ED Notes (Signed)
Signature pad unavailable. Pt ambulatory and with no s/sx distress

## 2020-06-26 IMAGING — DX DG CHEST 2V
2 series · 2 of 2 positions shown · non-contrast
Comparison: None.

CLINICAL DATA: Patient with left-sided chest pain and tightness.

EXAM:
CHEST - 2 VIEW

[chest pa]
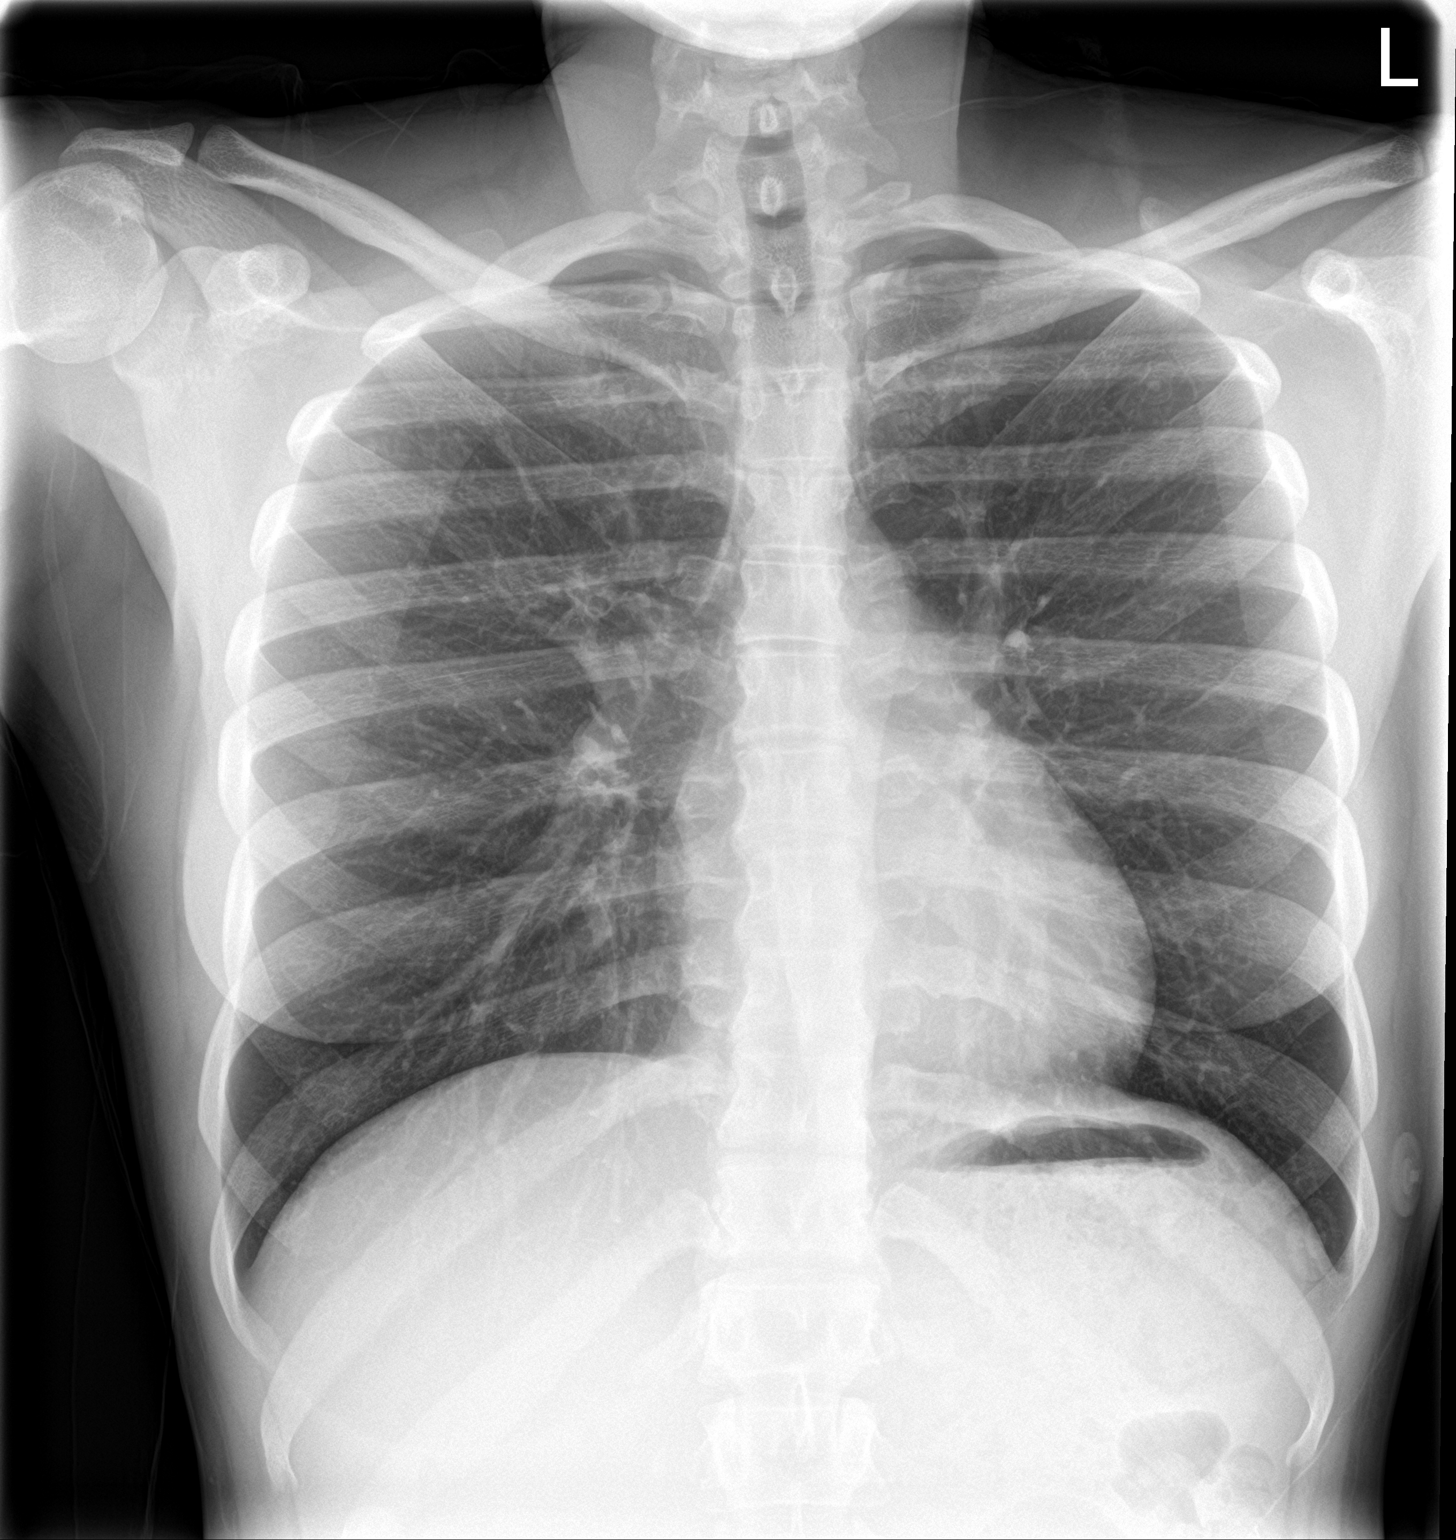

[chest lat]
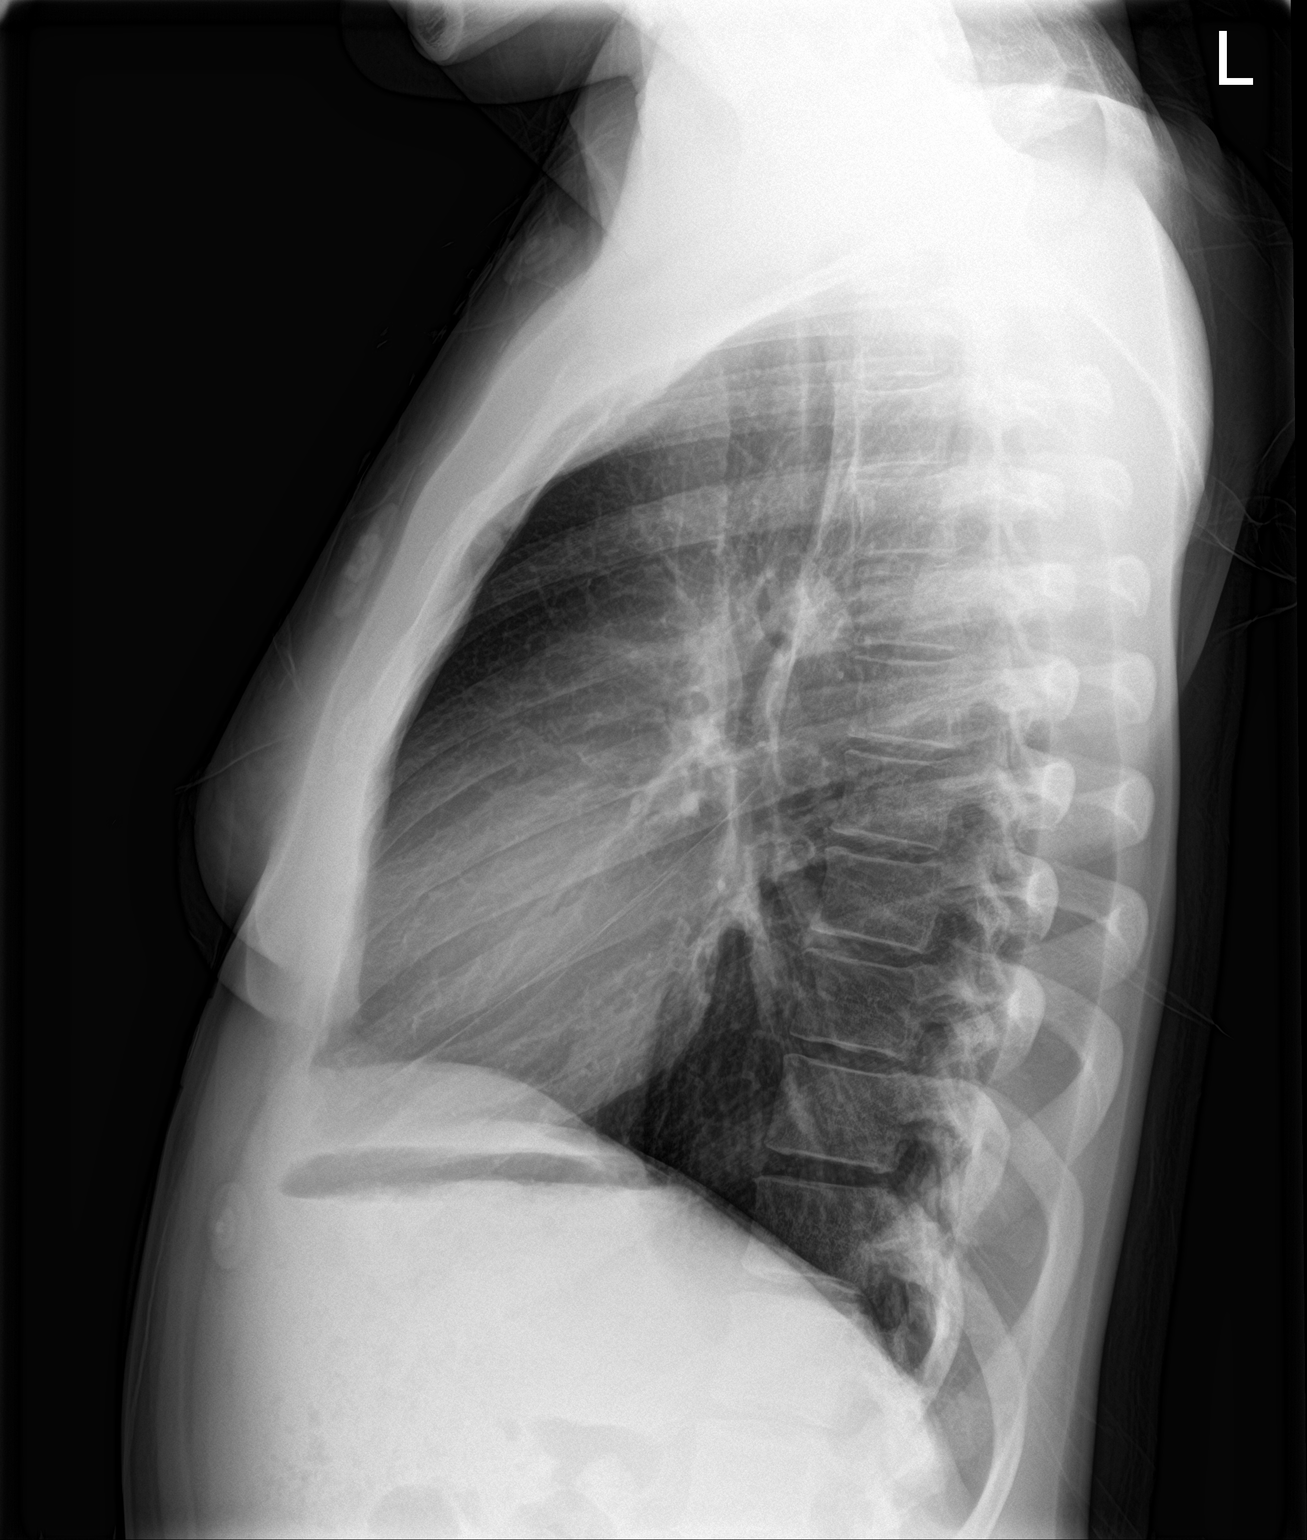

[2 of 2 positions shown; findings below may reference images not displayed]

FINDINGS: Normal cardiac and mediastinal contours. No consolidative pulmonary
opacities. No pleural effusion or pneumothorax. Regional skeleton is
unremarkable.
IMPRESSION: No active cardiopulmonary disease.
